# Patient Record
Sex: Female | Born: 1960 | Race: White | Hispanic: No | Marital: Married | Smoking: Former smoker
Health system: Southern US, Community
[De-identification: ages and names within clinical notes are randomized; demographics above are authoritative.]

## PROBLEM LIST (undated history)

## (undated) DIAGNOSIS — I341 Nonrheumatic mitral (valve) prolapse: Secondary | ICD-10-CM

## (undated) DIAGNOSIS — I4891 Unspecified atrial fibrillation: Secondary | ICD-10-CM

## (undated) HISTORY — DX: Nonrheumatic mitral (valve) prolapse: I34.1

## (undated) HISTORY — DX: Unspecified atrial fibrillation: I48.91

## (undated) HISTORY — PX: BREAST LUMPECTOMY: SHX2

---

## 1997-11-30 ENCOUNTER — Encounter: Payer: Self-pay | Admitting: Emergency Medicine

## 1997-11-30 ENCOUNTER — Emergency Department (HOSPITAL_COMMUNITY): Admission: EM | Admit: 1997-11-30 | Discharge: 1997-11-30 | Payer: Self-pay | Admitting: Emergency Medicine

## 1998-02-06 ENCOUNTER — Other Ambulatory Visit: Admission: RE | Admit: 1998-02-06 | Discharge: 1998-02-06 | Payer: Self-pay | Admitting: Obstetrics and Gynecology

## 1998-11-07 ENCOUNTER — Encounter (INDEPENDENT_AMBULATORY_CARE_PROVIDER_SITE_OTHER): Payer: Self-pay | Admitting: Specialist

## 1998-11-07 ENCOUNTER — Ambulatory Visit (HOSPITAL_COMMUNITY): Admission: RE | Admit: 1998-11-07 | Discharge: 1998-11-07 | Payer: Self-pay | Admitting: *Deleted

## 1999-03-10 ENCOUNTER — Other Ambulatory Visit: Admission: RE | Admit: 1999-03-10 | Discharge: 1999-03-10 | Payer: Self-pay | Admitting: Obstetrics and Gynecology

## 2000-03-15 ENCOUNTER — Other Ambulatory Visit: Admission: RE | Admit: 2000-03-15 | Discharge: 2000-03-15 | Payer: Self-pay | Admitting: Obstetrics and Gynecology

## 2001-03-18 ENCOUNTER — Other Ambulatory Visit: Admission: RE | Admit: 2001-03-18 | Discharge: 2001-03-18 | Payer: Self-pay | Admitting: Obstetrics and Gynecology

## 2002-04-14 ENCOUNTER — Other Ambulatory Visit: Admission: RE | Admit: 2002-04-14 | Discharge: 2002-04-14 | Payer: Self-pay | Admitting: Obstetrics and Gynecology

## 2003-11-29 ENCOUNTER — Other Ambulatory Visit: Admission: RE | Admit: 2003-11-29 | Discharge: 2003-11-29 | Payer: Self-pay | Admitting: Obstetrics and Gynecology

## 2004-06-27 ENCOUNTER — Ambulatory Visit: Payer: Self-pay | Admitting: Internal Medicine

## 2004-07-10 ENCOUNTER — Ambulatory Visit: Payer: Self-pay

## 2004-12-29 ENCOUNTER — Other Ambulatory Visit: Admission: RE | Admit: 2004-12-29 | Discharge: 2004-12-29 | Payer: Self-pay | Admitting: Obstetrics and Gynecology

## 2005-10-31 ENCOUNTER — Emergency Department (HOSPITAL_COMMUNITY): Admission: EM | Admit: 2005-10-31 | Discharge: 2005-10-31 | Payer: Self-pay | Admitting: Emergency Medicine

## 2006-01-25 ENCOUNTER — Ambulatory Visit: Payer: Self-pay | Admitting: Psychology

## 2006-01-28 ENCOUNTER — Emergency Department (HOSPITAL_COMMUNITY): Admission: EM | Admit: 2006-01-28 | Discharge: 2006-01-28 | Payer: Self-pay | Admitting: Emergency Medicine

## 2006-02-02 ENCOUNTER — Ambulatory Visit: Payer: Self-pay | Admitting: Psychology

## 2006-02-08 ENCOUNTER — Ambulatory Visit: Payer: Self-pay | Admitting: Psychology

## 2006-02-18 ENCOUNTER — Ambulatory Visit: Payer: Self-pay | Admitting: Psychology

## 2006-04-15 ENCOUNTER — Ambulatory Visit: Payer: Self-pay | Admitting: Psychology

## 2007-02-09 ENCOUNTER — Telehealth: Payer: Self-pay | Admitting: Internal Medicine

## 2007-02-09 ENCOUNTER — Telehealth: Payer: Self-pay | Admitting: *Deleted

## 2007-02-10 ENCOUNTER — Ambulatory Visit: Payer: Self-pay | Admitting: Internal Medicine

## 2007-02-14 LAB — CONVERTED CEMR LAB
Hep B C IgM: NEGATIVE
Hep B Core Total Ab: NEGATIVE
Hep B S AB Quant (Post): 11.5 milliintl units/mL
Mumps IgG: 1.58 — ABNORMAL HIGH
Rubella: 170.2 intl units/mL — ABNORMAL HIGH
Rubeola IgG: 0.78
Varicella IgG: 1.79 — ABNORMAL HIGH

## 2007-07-24 ENCOUNTER — Encounter: Payer: Self-pay | Admitting: Internal Medicine

## 2007-07-25 ENCOUNTER — Telehealth: Payer: Self-pay | Admitting: *Deleted

## 2007-07-26 ENCOUNTER — Ambulatory Visit: Payer: Self-pay | Admitting: Internal Medicine

## 2007-07-27 LAB — CONVERTED CEMR LAB
BUN: 15 mg/dL (ref 6–23)
CO2: 30 meq/L (ref 19–32)
Calcium: 9.9 mg/dL (ref 8.4–10.5)
Chloride: 106 meq/L (ref 96–112)
Creatinine, Ser: 0.8 mg/dL (ref 0.4–1.2)
GFR calc Af Amer: 99 mL/min
GFR calc non Af Amer: 82 mL/min
Glucose, Bld: 119 mg/dL — ABNORMAL HIGH (ref 70–99)
Magnesium: 2 mg/dL (ref 1.5–2.5)
Potassium: 4.3 meq/L (ref 3.5–5.1)
Sodium: 141 meq/L (ref 135–145)
TSH: 0.79 microintl units/mL (ref 0.35–5.50)

## 2007-08-02 ENCOUNTER — Ambulatory Visit: Payer: Self-pay

## 2007-08-02 ENCOUNTER — Encounter: Payer: Self-pay | Admitting: Internal Medicine

## 2007-08-02 ENCOUNTER — Encounter: Payer: Self-pay | Admitting: Cardiology

## 2008-01-13 ENCOUNTER — Telehealth: Payer: Self-pay | Admitting: Internal Medicine
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
## 2008-02-27 ENCOUNTER — Telehealth: Payer: Self-pay | Admitting: Internal Medicine

## 2008-02-29 ENCOUNTER — Ambulatory Visit: Payer: Self-pay | Admitting: Internal Medicine

## 2008-03-06 ENCOUNTER — Ambulatory Visit: Payer: Self-pay | Admitting: Internal Medicine

## 2008-03-06 LAB — CONVERTED CEMR LAB
ALT: 18 units/L (ref 0–35)
AST: 20 units/L (ref 0–37)
Albumin: 3.9 g/dL (ref 3.5–5.2)
Alkaline Phosphatase: 37 units/L — ABNORMAL LOW (ref 39–117)
BUN: 15 mg/dL (ref 6–23)
Basophils Absolute: 0 10*3/uL (ref 0.0–0.1)
Basophils Relative: 0.8 % (ref 0.0–3.0)
Bilirubin, Direct: 0.1 mg/dL (ref 0.0–0.3)
Blood in Urine, dipstick: NEGATIVE
CO2: 27 meq/L (ref 19–32)
Calcium: 9.2 mg/dL (ref 8.4–10.5)
Chloride: 106 meq/L (ref 96–112)
Cholesterol: 197 mg/dL (ref 0–200)
Creatinine, Ser: 0.9 mg/dL (ref 0.4–1.2)
Eosinophils Absolute: 0.1 10*3/uL (ref 0.0–0.7)
Eosinophils Relative: 2.5 % (ref 0.0–5.0)
GFR calc Af Amer: 87 mL/min
GFR calc non Af Amer: 72 mL/min
Glucose, Bld: 77 mg/dL (ref 70–99)
Glucose, Urine, Semiquant: NEGATIVE
HCT: 37.6 % (ref 36.0–46.0)
HDL: 82.6 mg/dL (ref >39.0)
Hemoglobin: 13.1 g/dL (ref 12.0–15.0)
LDL Cholesterol: 103 mg/dL — ABNORMAL HIGH (ref 0–99)
Lymphocytes Relative: 28.1 % (ref 12.0–46.0)
MCHC: 34.9 g/dL (ref 30.0–36.0)
MCV: 90.9 fL (ref 78.0–100.0)
Monocytes Absolute: 0.5 10*3/uL (ref 0.1–1.0)
Monocytes Relative: 9.5 % (ref 3.0–12.0)
Neutro Abs: 2.9 10*3/uL (ref 1.4–7.7)
Neutrophils Relative %: 59.1 % (ref 43.0–77.0)
Nitrite: NEGATIVE
Platelets: 246 10*3/uL (ref 150–400)
Potassium: 4.6 meq/L (ref 3.5–5.1)
RBC: 4.14 M/uL (ref 3.87–5.11)
RDW: 12.1 % (ref 11.5–14.6)
Sodium: 140 meq/L (ref 135–145)
Specific Gravity, Urine: 1.02
TSH: 0.91 microintl units/mL (ref 0.35–5.50)
Total Bilirubin: 0.9 mg/dL (ref 0.3–1.2)
Total CHOL/HDL Ratio: 2.4
Total Protein: 6.6 g/dL (ref 6.0–8.3)
Triglycerides: 56 mg/dL (ref 0–149)
Urobilinogen, UA: 0.2
VLDL: 11 mg/dL (ref 0–40)
WBC Urine, dipstick: NEGATIVE
WBC: 4.8 10*3/uL (ref 4.5–10.5)
pH: 6.5

## 2008-03-12 ENCOUNTER — Ambulatory Visit: Payer: Self-pay | Admitting: Internal Medicine

## 2008-03-12 DIAGNOSIS — R002 Palpitations: Secondary | ICD-10-CM

## 2008-04-26 ENCOUNTER — Telehealth: Payer: Self-pay | Admitting: Internal Medicine

## 2008-04-27 ENCOUNTER — Ambulatory Visit: Payer: Self-pay | Admitting: Internal Medicine

## 2008-04-27 DIAGNOSIS — R1011 Right upper quadrant pain: Secondary | ICD-10-CM | POA: Insufficient documentation

## 2008-04-27 DIAGNOSIS — R1084 Generalized abdominal pain: Secondary | ICD-10-CM

## 2008-04-27 LAB — CONVERTED CEMR LAB
ALT: 13 units/L (ref 0–35)
AST: 18 units/L (ref 0–37)
Albumin: 3.8 g/dL (ref 3.5–5.2)
Alkaline Phosphatase: 54 units/L (ref 39–117)
BUN: 11 mg/dL (ref 6–23)
Basophils Absolute: 0.1 10*3/uL (ref 0.0–0.1)
Basophils Relative: 1.3 % (ref 0.0–3.0)
Bilirubin Urine: NEGATIVE
Bilirubin, Direct: 0.1 mg/dL (ref 0.0–0.3)
CO2: 26 meq/L (ref 19–32)
Calcium: 9.4 mg/dL (ref 8.4–10.5)
Chloride: 105 meq/L (ref 96–112)
Creatinine, Ser: 0.8 mg/dL (ref 0.4–1.2)
Eosinophils Absolute: 0.1 10*3/uL (ref 0.0–0.7)
Eosinophils Relative: 0.7 % (ref 0.0–5.0)
GFR calc Af Amer: 99 mL/min
GFR calc non Af Amer: 82 mL/min
Glucose, Bld: 108 mg/dL — ABNORMAL HIGH (ref 70–99)
Glucose, Urine, Semiquant: NEGATIVE
HCT: 37.7 % (ref 36.0–46.0)
Hemoglobin: 13.1 g/dL (ref 12.0–15.0)
Ketones, urine, test strip: NEGATIVE
Lipase: 25 units/L (ref 11.0–59.0)
Lymphocytes Relative: 11.5 % — ABNORMAL LOW (ref 12.0–46.0)
MCHC: 34.8 g/dL (ref 30.0–36.0)
MCV: 89.3 fL (ref 78.0–100.0)
Monocytes Absolute: 0.7 10*3/uL (ref 0.1–1.0)
Monocytes Relative: 7 % (ref 3.0–12.0)
Neutro Abs: 7.3 10*3/uL (ref 1.4–7.7)
Neutrophils Relative %: 79.5 % — ABNORMAL HIGH (ref 43.0–77.0)
Nitrite: POSITIVE
Platelets: 241 10*3/uL (ref 150–400)
Potassium: 4 meq/L (ref 3.5–5.1)
RBC: 4.23 M/uL (ref 3.87–5.11)
RDW: 11.7 % (ref 11.5–14.6)
Sodium: 139 meq/L (ref 135–145)
Specific Gravity, Urine: 1.02
Total Bilirubin: 0.9 mg/dL (ref 0.3–1.2)
Total Protein: 6.9 g/dL (ref 6.0–8.3)
Urobilinogen, UA: 0.2
WBC: 9.3 10*3/uL (ref 4.5–10.5)
pH: 5.5

## 2008-04-28 ENCOUNTER — Encounter: Payer: Self-pay | Admitting: Internal Medicine

## 2008-05-01 ENCOUNTER — Telehealth: Payer: Self-pay | Admitting: *Deleted

## 2009-03-06 ENCOUNTER — Telehealth (INDEPENDENT_AMBULATORY_CARE_PROVIDER_SITE_OTHER): Payer: Self-pay | Admitting: *Deleted

## 2009-05-15 ENCOUNTER — Telehealth: Payer: Self-pay | Admitting: Internal Medicine

## 2009-05-16 ENCOUNTER — Ambulatory Visit: Payer: Self-pay | Admitting: Internal Medicine

## 2009-05-16 ENCOUNTER — Telehealth (INDEPENDENT_AMBULATORY_CARE_PROVIDER_SITE_OTHER): Payer: Self-pay | Admitting: *Deleted

## 2010-01-16 ENCOUNTER — Ambulatory Visit: Payer: Self-pay | Admitting: Internal Medicine

## 2010-04-01 ENCOUNTER — Telehealth: Payer: Self-pay | Admitting: *Deleted

## 2010-05-06 NOTE — Progress Notes (Signed)
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 Can i sch?  Initial call taken by: Heron Sabins,  May 15, 2009 8:48 AM  Follow-up for Phone Call        ok (if they dont need  chain of custody  test. )  get more specific about what they need  Follow-up by: Madelin Headings MD,  May 15, 2009 1:28 PM  Additional Follow-up for Phone Call Additional follow up Details #1::        need standard drug testing for school admission. 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 Therefore, pt was adv that standard urine drug test would be administered because there are no doucuments / requests to go by stating anything different.... Pt adv that would be fine.  Pt scheduled appt w/ lab for today @ 9:45am for same.  Follow-up by: Debbra Riding,  May 16, 2009 8:58 AM

## 2010-05-06 NOTE — Progress Notes (Signed)
  Left a message for her to return my call about the urine drug screen.

## 2010-05-06 NOTE — Assessment & Plan Note (Signed)
Summary: FLU SHOT//[facility]  Nurse Visit   Allergies: No Known Drug Allergies  Orders Added: 1)  Admin 1st Vaccine [90471] 2)  Flu Vaccine 59yrs + [29562] Flu Vaccine Consent Questions     Do you have a history of severe allergic reactions to this vaccine? no    Any prior history of allergic reactions to egg and/or gelatin? no    Do you have a sensitivity to the preservative Thimersol? no    Do you have a past history of Guillan-Barre Syndrome? no    Do you currently have an acute febrile illness? no    Have you ever had a severe reaction to latex? no    Vaccine information given and explained to patient? yes    Are you currently pregnant? no    Lot Number:[id number]   Exp Date:10/04/2010   Site Given  Left Deltoid IM Romualdo Bolk, CMA Duncan Dull)  January 16, 2010 4:36 PM

## 2010-05-08 NOTE — Progress Notes (Signed)
Summary: Pt needs proof of last flu vaccine, faxed to her work  Phone Note Call from Patient Call back at [employer] [phone]   Caller: Patient Summary of Call: Pt called and needs to have proof of rcvng flu shot this year, sent to her work. Pls fax info to [phone] [facility] ICU attn Antony Odea Initial call taken by: Lucy Antigua,  April 01, 2010 9:57 AM  Follow-up for Phone Call        results faxed to [facility] ICU Follow-up by: Romualdo Bolk, CMA Duncan Dull),  April 01, 2010 12:04 PM

## 2011-01-30 ENCOUNTER — Ambulatory Visit (INDEPENDENT_AMBULATORY_CARE_PROVIDER_SITE_OTHER): Payer: PRIVATE HEALTH INSURANCE | Admitting: Gynecology

## 2011-01-30 ENCOUNTER — Encounter: Payer: Self-pay | Admitting: Gynecology

## 2011-01-30 VITALS — BP 130/80 | Ht 66.5 in | Wt 149.0 lb

## 2011-01-30 DIAGNOSIS — N926 Irregular menstruation, unspecified: Secondary | ICD-10-CM

## 2011-01-30 DIAGNOSIS — I4891 Unspecified atrial fibrillation: Secondary | ICD-10-CM | POA: Insufficient documentation

## 2011-01-30 DIAGNOSIS — I341 Nonrheumatic mitral (valve) prolapse: Secondary | ICD-10-CM | POA: Insufficient documentation

## 2011-01-30 DIAGNOSIS — D259 Leiomyoma of uterus, unspecified: Secondary | ICD-10-CM

## 2011-01-30 NOTE — Progress Notes (Signed)
50 year old G2 P2 vasectomy birth control presents complaining of worsening menorrhagia irregular menses with bleeding on and off throughout the month pelvic pressure dyspareunia. History of leiomyoma which appear to be getting larger over serial exams by other physicians. She had a sonohysterogram year ago showed a normal cavity with multiple myomas the largest measuring 5 cm per office visit note through Dr. Octavio Graves office. Follow up office note May 2012 showed a 12 week size uterus and repeat ultrasound at that time again confirmed multiple myomas right ovary was normal left ovary was not visualized clearly. Patient had been given a trial of oral contraceptives but had bleeding through this and wants to discuss alternatives. She is up to date as far as regular exams the last of which was in [place] in May. She has no history of significantly abnormal Pap smears.  Exam Abdomen soft nontender without masses guarding rebound organomegaly Pelvic external BUS vagina normal cervix within 2 fingerbreadths of the introitus, uterus retroverted bulky 10-12 week size adnexa without clear masses rectovaginal exam confirms  Assessment and plan: Worsening menorrhagia irregular menses pelvic pressure dyspareunia leiomyoma. Options were reviewed to include observation continued attempts at hormonal manipulation Mirena IUD endometrial ablation uterine artery embolization multiple myomectomy and hysterectomy. I reviewed with her average age of menopause is 20 at age 66 the question is whether she could be temporized through menopause or not recognizing that she could still have years of symptoms in the discomfort. I think given the symptoms of pelvic pressure and dyspareunia described as deep dyspareunia something being hit with her cervix 2 fingerbreadths within the introitus I think hysterectomy is probably the most appropriate choice to take care of all of her issues. She has had 2 vaginal births think that we could  approach this either as a LAVH or TVH and the recommendation would be to preoperatively treat her with a 2 - 3 month course of depo Lupron 2 stop her irregular bleeding maximize her hemoglobin and hopefully shrink her uterus to increase the likelihood of a successful vaginal hysterectomy. The differential suggest that we check an FSH to see where we stand as well as a TSH as baseline CBC. You should endometrial biopsy was discussed she had a negative sonohysterogram a year ago I think that if we're going to move toward surgery that is not essential given the obvious etiology and her younger age. I also do not think this necessarily repeat the ultrasound.  The patient will follow up for lab work and then we'll move towards scheduling her surgery and Depo-Lupron preoperatively. She will represent for a full preoperative consult before hand.

## 2011-02-09 ENCOUNTER — Telehealth: Payer: Self-pay | Admitting: *Deleted

## 2011-02-09 NOTE — Telephone Encounter (Signed)
Pt called wanting lab result from recent office visit, results given to pt. Pt also wanted to know if insurance was going to help pay for her depo lupron, per amy insurance company has not finished processing pt benefits. Pt informed of this as well.

## 2011-02-10 ENCOUNTER — Telehealth: Payer: Self-pay | Admitting: *Deleted

## 2011-02-10 NOTE — Telephone Encounter (Signed)
Pt called complaining of lower pelvic vibration sensation. She states it vibrates about 12 times per min. I told pt I have no clue what this could be and she should make office visit to see doctor. Pt says she will watch for now and call back if this should continue.

## 2011-02-18 ENCOUNTER — Telehealth: Payer: Self-pay | Admitting: *Deleted

## 2011-02-18 NOTE — Telephone Encounter (Signed)
Message copied by Libby Maw on Wed Feb 18, 2011 11:12 AM ------      Message from: Richardson Chiquito      Created: Mon Feb 02, 2011  9:11 AM                   ----- Message -----         From: Dara Lords, MD         Sent: 01/30/2011   5:13 PM           To: Janus Molder, CMA            Arrange for Depo-Lupron 11.25 mg as a preoperative treatment for planned hysterectomy diagnosis leiomyoma menorrhagia patient will have hysterectomy in the 2-3 month window after the shot. I sent a request through to schedule the surgery with Olegario Messier she can coordinate this with you

## 2011-02-18 NOTE — Telephone Encounter (Signed)
Patient called back about benefits for Lupron.  Her cost would be over 2000 dollars.  Wants to know if shot necessary for surgery or is there another option?

## 2011-02-18 NOTE — Telephone Encounter (Signed)
One of the purposes for Lupron would be to shrink the uterus to improve the chances that we would be successful vaginally. Having said that we could try suppressing her periods with Megace 20 mg daily through surgery and then we could then schedule her now for the surgery instead of waiting the 2-3 months. If she wants to do that let me know and I will prescribe the Megace and get her surgery scheduled.  Other option would be to try to see if the Depo-Lupron company would discount the shot.

## 2011-02-19 MED ORDER — MEGESTROL ACETATE 20 MG PO TABS: 20.0000 mg | ORAL_TABLET | Freq: Every day | ORAL | Status: AC

## 2011-02-19 NOTE — Telephone Encounter (Signed)
Plan Megace 20 mg daily and I will have Carrie Velasquez contact her to arrange surgery. Order was placed for the Megace

## 2011-02-19 NOTE — Telephone Encounter (Signed)
Patient said she would like to proceed with Megace.

## 2011-02-20 ENCOUNTER — Telehealth: Payer: Self-pay

## 2011-02-20 NOTE — Telephone Encounter (Signed)
Left message for patient to call me regarding scheduling surgery.

## 2011-02-20 NOTE — Telephone Encounter (Signed)
Patient called regarding surgery.  She does not think now is a good time.  She is new at her job and cannot afford to be out.  She asked how long she would be out if Dr. Velvet Bathe had to do abdominal hyst. I told her 4-6 weeks. She said no way possible at all that she could do that. She asked about how long out of work with LAVH. 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  She asked about her insurance/ financial arrangements and we discussed that.  She has a ski trip planned for [facility] at [place] and asked if she would be able to do that if she tried to fit it in in December.  I told her that did not sound like a wise thing.  I would not have anything before Dec 11th and no time for recovery. She said she wants to wait until Spring 2013. She will have been with her job longer by then and she has another ski trip scheduled 2013 and will need to work around that.  She said she will call me when she can work it out but she just does not feel like the time is now.  She said, also, she may be able to do the Lupron first in the Spring.

## 2011-05-28 ENCOUNTER — Telehealth: Payer: Self-pay

## 2011-05-28 NOTE — Telephone Encounter (Signed)
Yes I want her to start Megace 20 mg daily through surgery to see if we can suppress her menses. If she needs more than we can call that in for her.

## 2011-05-28 NOTE — Telephone Encounter (Signed)
We can plan for an LAVH.

## 2011-05-28 NOTE — Telephone Encounter (Signed)
Patient called to schedule LAVH hopefully for April. Her insurance is changing in May and she wants to get it in before then.  She said you had planned LAVH.  She wanted you to know that the Lupron was just too expensive and she could not do that.  She has the RX for Megace that she filled back in November but has not taken it and wondered if you want her to take that.    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 She asked about time out of work she said she heard 2-4 weeks.  I told her that was correct but that two weeks was probably for more sedentary positions and she may find that she needs the 3-4 weeks.   Please advise what she needs to be doing/taking meantime to prepare for surgery.  I did tell her that at last visit you recommended iron tab qd until surgery and she will start on that.

## 2011-05-28 NOTE — Telephone Encounter (Signed)
Just wanted to be sure you saw my note below. Did you want her to take the Megace before surgery?

## 2011-05-29 ENCOUNTER — Telehealth: Payer: Self-pay | Admitting: Internal Medicine

## 2011-05-29 NOTE — Telephone Encounter (Signed)
Pt would like shannon to return her call personally

## 2011-05-29 NOTE — Telephone Encounter (Signed)
Spoke to pt and answered all questions

## 2011-05-29 NOTE — Telephone Encounter (Signed)
Patient advised.

## 2011-06-01 ENCOUNTER — Telehealth: Payer: Self-pay

## 2011-06-01 NOTE — Telephone Encounter (Signed)
Patient is considering scheduling surgery mid-April.  She asked if it would be okay for her to fly to [place] the first week of May for [employer] ("I would be in my 3rd week of recovery".).   Also, patient said the highest OTC iron tablet she could find was 27mg  and she wondered if you needed to call her in a prescription for iron?

## 2011-06-02 NOTE — Telephone Encounter (Signed)
Patient called because she has not heard back from me.  She is ready to schedule but needed to know about air travel per note below.  Also, wanted to add that she needs Megace refills.   Dr. Tilford Pillar see note below and advise. Thanks

## 2011-06-02 NOTE — Telephone Encounter (Signed)
If the patient has a successful laparoscopic hysterectomy then she probably could take a trip in 3 weeks with no heavy lifting. If she has any postoperative complications or if we have to do an abdominal approach, which in her case given the larger nature of her uterus is a real possibility, that would be unlikely at 2-3 weeks after abdominal hysterectomy she would be able to travel from a discomfort standpoint although she was feeling well she could consider it.

## 2011-06-03 ENCOUNTER — Telehealth: Payer: Self-pay

## 2011-06-03 MED ORDER — MEGESTROL ACETATE 20 MG PO TABS: 20.0000 mg | ORAL_TABLET | Freq: Every day | ORAL | Status: AC

## 2011-06-03 NOTE — Telephone Encounter (Signed)
Also, patient said the highest OTC iron tablet she could find was 27mg  and she wondered if you needed to call her in a prescription for iron?

## 2011-06-03 NOTE — Telephone Encounter (Signed)
I closed last phone call encounter unintentionally before I was finished.  I advised patient what Dr. Velvet Bathe said regarding travel several weeks after surgery and also that she should be able to find OTC iron supplement and to just ask pharmacist for assistance.  I spoke with her regarding surgery dates and she wanted April 9 and we scheduled her that day at 7:30am.  She will call me back to schedule her preop consult.  She needs additional megace called in and I will do that for her per Dr. Velvet Bathe.

## 2011-06-03 NOTE — Telephone Encounter (Signed)
There plenty of iron supplements OTC. She's looking for a 325 mg equivalent. Just asked the pharmacist and he/she should be able to help. I know [facility] has plenty of iron supplements available.

## 2011-06-25 ENCOUNTER — Encounter (HOSPITAL_COMMUNITY): Payer: Self-pay | Admitting: Pharmacist

## 2011-07-06 ENCOUNTER — Ambulatory Visit (INDEPENDENT_AMBULATORY_CARE_PROVIDER_SITE_OTHER): Payer: PRIVATE HEALTH INSURANCE | Admitting: Gynecology

## 2011-07-06 ENCOUNTER — Encounter: Payer: Self-pay | Admitting: Gynecology

## 2011-07-06 VITALS — BP 120/76

## 2011-07-06 DIAGNOSIS — N920 Excessive and frequent menstruation with regular cycle: Secondary | ICD-10-CM

## 2011-07-06 DIAGNOSIS — D259 Leiomyoma of uterus, unspecified: Secondary | ICD-10-CM

## 2011-07-06 DIAGNOSIS — N926 Irregular menstruation, unspecified: Secondary | ICD-10-CM

## 2011-07-06 DIAGNOSIS — IMO0002 Reserved for concepts with insufficient information to code with codable children: Secondary | ICD-10-CM

## 2011-07-06 NOTE — H&P (Signed)
Carrie Velasquez 1960/08/21 [id number]   History and Physical    Chief complaint:  worsening menorrhagia, irregular menses with bleeding on and off throughout the month, pelvic pressure, dyspareunia.   History of present illness: 51 y.o.  G2 P2 vasectomy birth control presents complaining of worsening menorrhagia, irregular menses with bleeding on and off throughout the month, pelvic pressure, dyspareunia.  History of leiomyoma with sonohysterogram approximately a year ago showed a normal cavity with multiple myomas. Options were reviewed to include observation, continued attempts at hormonal manipulation, Mirena IUD, endometrial ablation, uterine artery embolization, multiple myomectomy and hysterectomy. Patient was evaluated in the fall initially and plan was to begin Depo-Lupron suppression with follow up hysterectomy but do to cost of the medication she did not start this and currently has been suppressing menses with Megace and supplementing her iron orally. She is admitted at this time for LAVH.    Past medical history,surgical history, medications, allergies, family history and social history were all reviewed and documented in the EPIC chart. ROS:  Was performed and pertinent positives and negatives are included in the history of present illness.  Exam: General: well developed, well nourished female, no acute distress HEENT: normal  Lungs: clear to auscultation without wheezing, rales or rhonchi  Cardiac: regular rate without rubs, murmurs or gallops  Abdomen: soft, nontender without masses, guarding, rebound, organomegaly  Pelvic: external bus vagina: normal   Cervix: grossly normal within 2 fingerbreadths of the introitus Uterus: 12 weeks size, retroverted, midline and mobile, nontender.  Adnexa: without masses or tenderness      Assessment/Plan:  51 year old G2 P2 female with multiple myomas menorrhagia, irregular menses, iron deficiency anemia, pelvic pressure and  dyspareunia. Options for management were outlined as per above. We initially had planned for Depo-Lupron suppression both for hemoglobin recovery and strength of the uterus the 2 tubes expensive medication she never started this. We have attempted to suppress her menses with Megace and she has been on iron supplementation. Check of her FSH and the fall was normal at 7. She is admitted at this time for LAVH. I reviewed is involved with hysterectomy with her and her husband to include absolute and irreversible sterility. Sexuality following hysterectomy was also reviewed with the potential for persistent orgasmic dysfunction and persistent dyspareunia understood and accepted.  The ovarian conservation issue was reviewed with her particularly at age 51 and the options of keeping both ovaries or removing both ovaries reviewed. The patient understands by removing her ovaries she would be at risk for symptomatic hypoestrogenism as well as the potential for accelerated cardiovascular risk and osteoporosis. She would have risk reductive surgery as far as ovarian cancer and breast cancer. By keeping her ovaries she will keep hormonal function to also will have the risk of ovarian disease in the future to include benign disease requiring reoperation as well as the risk of ovarian cancer. She has no strong history of ovarian cancer and breast cancer in the family and she prefers to keep both ovaries accepting the risks of ovarian disease in the future, but she does give me the permission to remove one or both ovaries if at the time of surgery significant disease is encountered and is my best intraoperative decision to do so.The expected intraoperative and postoperative courses were reviewed with them.  We will attempt a laparoscopic approach and I reviewed what is involved with this to include multiple port sites, insufflation, trocar placement. She understands given the bulk of her uterus or if complications  arise, that we  may need to abandon this approach and proceed with a TAH which would mean a larger incision and a longer recovery and she understands and accepts this. The risk of infection requiring prolonged antibiotics, opening and draining of abscess/hematoma formation. Incisional complications requiring opening and draining of incisions, closure by secondary intention and long-term issues of cosmetics/keloids and hernia formation reviewed. The realistic risk of transfusion given her lower hemoglobin preoperatively and the risks of transfusion reaction, hepatitis, HIV, mad cow disease and other unknown entities was all discussed understood and accepted. The risk of inadvertent injury to internal organs including bowel, bladder, ureters, vessels and nerves necessitating major exploratory reparative surgeries and future reparative surgeries, either immediately recognized or delay recognized, including bowel resection, bladder repair, ureteral damage repair, ostomy formation was all discussed understood and accepted. The patient's questions were answered to her satisfaction she is ready to proceed with surgery.    Dara Lords MD, 5:23 PM 07/06/2011

## 2011-07-06 NOTE — Patient Instructions (Signed)
Followup for surgery as scheduled. 

## 2011-07-06 NOTE — Progress Notes (Signed)
ENRIQUE WEISS Apr 10, 1960 [id number]   Preoperative consult    Chief complaint:  worsening menorrhagia, irregular menses with bleeding on and off throughout the month, pelvic pressure, dyspareunia.   History of present illness: 51 y.o.  G2 P2 vasectomy birth control presents complaining of worsening menorrhagia, irregular menses with bleeding on and off throughout the month, pelvic pressure, dyspareunia.  History of leiomyoma with sonohysterogram approximately a year ago showed a normal cavity with multiple myomas. Options were reviewed to include observation, continued attempts at hormonal manipulation, Mirena IUD, endometrial ablation, uterine artery embolization, multiple myomectomy and hysterectomy. Patient was evaluated in the fall initially and plan was to begin Depo-Lupron suppression with follow up hysterectomy but do to cost of the medication she did not start this and currently has been suppressing menses with Megace and supplementing her iron orally. She is admitted at this time for LAVH.    Past medical history,surgical history, medications, allergies, family history and social history were all reviewed and documented in the EPIC chart. ROS:  Was performed and pertinent positives and negatives are included in the history of present illness.  Exam: General: well developed, well nourished female, no acute distress HEENT: normal  Lungs: clear to auscultation without wheezing, rales or rhonchi  Cardiac: regular rate without rubs, murmurs or gallops  Abdomen: soft, nontender without masses, guarding, rebound, organomegaly  Pelvic: external bus vagina: normal   Cervix: grossly normal within 2 fingerbreadths of the introitus Uterus: 12 weeks size, retroverted, midline and mobile, nontender.  Adnexa: without masses or tenderness      Assessment/Plan:  51 year old G2 P2 female with multiple myomas menorrhagia, irregular menses, iron deficiency anemia, pelvic pressure and  dyspareunia. Options for management were outlined as per above. We initially had planned for Depo-Lupron suppression both for hemoglobin recovery and strength of the uterus the 2 tubes expensive medication she never started this. We have attempted to suppress her menses with Megace and she has been on iron supplementation. Check of her FSH and the fall was normal at 7. She is admitted at this time for LAVH. I reviewed is involved with hysterectomy with her and her husband to include absolute and irreversible sterility. Sexuality following hysterectomy was also reviewed with the potential for persistent orgasmic dysfunction and persistent dyspareunia understood and accepted.  The ovarian conservation issue was reviewed with her particularly at age 76 and the options of keeping both ovaries or removing both ovaries reviewed. The patient understands by removing her ovaries she would be at risk for symptomatic hypoestrogenism as well as the potential for accelerated cardiovascular risk and osteoporosis. She would have risk reductive surgery as far as ovarian cancer and breast cancer. By keeping her ovaries she will keep hormonal function to also will have the risk of ovarian disease in the future to include benign disease requiring reoperation as well as the risk of ovarian cancer. She has no strong history of ovarian cancer and breast cancer in the family and she prefers to keep both ovaries accepting the risks of ovarian disease in the future, but she does give me the permission to remove one or both ovaries if at the time of surgery significant disease is encountered and is my best intraoperative decision to do so.The expected intraoperative and postoperative courses were reviewed with them.  We will attempt a laparoscopic approach and I reviewed what is involved with this to include multiple port sites, insufflation, trocar placement. She understands given the bulk of her uterus or if complications arise,  that we  may need to abandon this approach and proceed with a TAH which would mean a larger incision and a longer recovery and she understands and accepts this. The risk of infection requiring prolonged antibiotics, opening and draining of abscess/hematoma formation. Incisional complications requiring opening and draining of incisions, closure by secondary intention and long-term issues of cosmetics/keloids and hernia formation reviewed. The realistic risk of transfusion given her lower hemoglobin preoperatively and the risks of transfusion reaction, hepatitis, HIV, mad cow disease and other unknown entities was all discussed understood and accepted. The risk of inadvertent injury to internal organs including bowel, bladder, ureters, vessels and nerves necessitating major exploratory reparative surgeries and future reparative surgeries, either immediately recognized or delay recognized, including bowel resection, bladder repair, ureteral damage repair, ostomy formation was all discussed understood and accepted. The patient's questions were answered to her satisfaction she is ready to proceed with surgery.    Dara Lords MD, 5:09 PM 07/06/2011

## 2011-07-08 ENCOUNTER — Encounter (HOSPITAL_COMMUNITY): Payer: Self-pay

## 2011-07-08 ENCOUNTER — Encounter (HOSPITAL_COMMUNITY)
Admission: RE | Admit: 2011-07-08 | Discharge: 2011-07-08 | Disposition: A | Discharge status: Home / Self Care | Payer: Medicaid - Out of State | Source: Ambulatory Visit | Attending: Gynecology | Admitting: Gynecology

## 2011-07-08 LAB — COMPREHENSIVE METABOLIC PANEL
ALT: 21 U/L (ref 0–35)
AST: 22 U/L (ref 0–37)
Albumin: 4.2 g/dL (ref 3.5–5.2)
Alkaline Phosphatase: 46 U/L (ref 39–117)
Chloride: 104 mEq/L (ref 96–112)
Potassium: 4.6 mEq/L (ref 3.5–5.1)
Sodium: 136 mEq/L (ref 135–145)
Total Bilirubin: 0.6 mg/dL (ref 0.3–1.2)

## 2011-07-08 LAB — CBC
Platelets: 320 10*3/uL (ref 150–400)
RDW: 18.9 % — ABNORMAL HIGH (ref 11.5–15.5)
WBC: 6.5 10*3/uL (ref 4.0–10.5)

## 2011-07-08 LAB — SURGICAL PCR SCREEN: Staphylococcus aureus: POSITIVE — AB

## 2011-07-08 NOTE — Patient Instructions (Signed)
YOUR PROCEDURE IS SCHEDULED ON:07/14/11  ENTER THROUGH THE MAIN ENTRANCE OF [facility] AT:0600 am  USE DESK PHONE AND DIAL [phone] TO INFORM us OF YOUR ARRIVAL  CALL [phone] IF YOU HAVE ANY QUESTIONS OR PROBLEMS PRIOR TO YOUR ARRIVAL.  REMEMBER: DO NOT EAT OR DRINK AFTER MIDNIGHT :Monday  SPECIAL INSTRUCTIONS:   YOU MAY BRUSH YOUR TEETH THE MORNING OF SURGERY   TAKE THESE MEDICINES THE DAY OF SURGERY WITH SIP OF WATER:none   DO NOT WEAR JEWELRY, EYE MAKEUP, LIPSTICK OR DARK FINGERNAIL POLISH DO NOT WEAR LOTIONS  DO NOT SHAVE FOR 48 HOURS PRIOR TO SURGERY  YOU WILL NOT BE ALLOWED TO DRIVE YOURSELF HOME.  NAME OF DRIVER:Dan Su Hilt

## 2011-07-13 MED ORDER — DEXTROSE 5 % IV SOLN
1.0000 g | INTRAVENOUS | Status: AC
  Administered 2011-07-14: 1 g via INTRAVENOUS
  Filled 2011-07-13: qty 1

## 2011-07-14 ENCOUNTER — Encounter (HOSPITAL_COMMUNITY): Payer: Self-pay | Admitting: Anesthesiology

## 2011-07-14 ENCOUNTER — Ambulatory Visit (HOSPITAL_COMMUNITY): Payer: Medicaid - Out of State | Admitting: Anesthesiology

## 2011-07-14 ENCOUNTER — Ambulatory Visit (HOSPITAL_COMMUNITY)
Admission: RE | Admit: 2011-07-14 | Discharge: 2011-07-14 | Disposition: A | Discharge status: Home / Self Care | Payer: Medicaid - Out of State | Source: Ambulatory Visit | Attending: Gynecology | Admitting: Gynecology

## 2011-07-14 ENCOUNTER — Encounter (HOSPITAL_COMMUNITY): Payer: Self-pay | Admitting: *Deleted

## 2011-07-14 ENCOUNTER — Other Ambulatory Visit: Payer: Self-pay | Admitting: Gynecology

## 2011-07-14 ENCOUNTER — Telehealth: Payer: Self-pay | Admitting: *Deleted

## 2011-07-14 ENCOUNTER — Encounter (HOSPITAL_COMMUNITY)
Admission: RE | Disposition: A | Discharge status: Home / Self Care | Payer: Self-pay | Source: Ambulatory Visit | Attending: Gynecology

## 2011-07-14 DIAGNOSIS — N920 Excessive and frequent menstruation with regular cycle: Secondary | ICD-10-CM | POA: Insufficient documentation

## 2011-07-14 DIAGNOSIS — IMO0002 Reserved for concepts with insufficient information to code with codable children: Secondary | ICD-10-CM | POA: Insufficient documentation

## 2011-07-14 DIAGNOSIS — Z9889 Other specified postprocedural states: Secondary | ICD-10-CM

## 2011-07-14 DIAGNOSIS — Z01812 Encounter for preprocedural laboratory examination: Secondary | ICD-10-CM | POA: Insufficient documentation

## 2011-07-14 DIAGNOSIS — D509 Iron deficiency anemia, unspecified: Secondary | ICD-10-CM | POA: Insufficient documentation

## 2011-07-14 DIAGNOSIS — D259 Leiomyoma of uterus, unspecified: Secondary | ICD-10-CM

## 2011-07-14 DIAGNOSIS — N926 Irregular menstruation, unspecified: Secondary | ICD-10-CM | POA: Insufficient documentation

## 2011-07-14 DIAGNOSIS — Z01818 Encounter for other preprocedural examination: Secondary | ICD-10-CM | POA: Insufficient documentation

## 2011-07-14 DIAGNOSIS — N949 Unspecified condition associated with female genital organs and menstrual cycle: Secondary | ICD-10-CM | POA: Insufficient documentation

## 2011-07-14 HISTORY — PX: LAPAROSCOPIC ASSISTED VAGINAL HYSTERECTOMY: SHX5398

## 2011-07-14 SURGERY — HYSTERECTOMY, VAGINAL, LAPAROSCOPY-ASSISTED
Anesthesia: General | Wound class: Clean Contaminated

## 2011-07-14 MED ORDER — KETOROLAC TROMETHAMINE 30 MG/ML IJ SOLN: 30.0000 mg | Freq: Four times a day (QID) | INTRAMUSCULAR | Status: DC

## 2011-07-14 MED ORDER — HYDROMORPHONE HCL PF 1 MG/ML IJ SOLN
INTRAMUSCULAR | Status: AC
  Filled 2011-07-14: qty 1

## 2011-07-14 MED ORDER — OXYCODONE-ACETAMINOPHEN 5-325 MG PO TABS
1.0000 | ORAL_TABLET | ORAL | Status: DC | PRN
  Administered 2011-07-14: 2 via ORAL
  Filled 2011-07-14: qty 2

## 2011-07-14 MED ORDER — MIDAZOLAM HCL 2 MG/2ML IJ SOLN
INTRAMUSCULAR | Status: AC
  Filled 2011-07-14: qty 2

## 2011-07-14 MED ORDER — ONDANSETRON HCL 4 MG/2ML IJ SOLN
INTRAMUSCULAR | Status: AC
  Filled 2011-07-14: qty 2

## 2011-07-14 MED ORDER — DEXTROSE-NACL 5-0.9 % IV SOLN
INTRAVENOUS | Status: DC
  Administered 2011-07-14: 15:00:00 via INTRAVENOUS

## 2011-07-14 MED ORDER — HYDROCODONE-ACETAMINOPHEN 5-325 MG PO TABS: 2.0000 | ORAL_TABLET | Freq: Four times a day (QID) | ORAL | Status: AC | PRN

## 2011-07-14 MED ORDER — KETOROLAC TROMETHAMINE 30 MG/ML IJ SOLN
INTRAMUSCULAR | Status: AC
  Filled 2011-07-14: qty 1

## 2011-07-14 MED ORDER — GLYCOPYRROLATE 0.2 MG/ML IJ SOLN
INTRAMUSCULAR | Status: DC | PRN
  Administered 2011-07-14: 0.4 mg via INTRAVENOUS

## 2011-07-14 MED ORDER — DEXAMETHASONE SODIUM PHOSPHATE 10 MG/ML IJ SOLN
INTRAMUSCULAR | Status: AC
  Filled 2011-07-14: qty 1

## 2011-07-14 MED ORDER — ROCURONIUM BROMIDE 100 MG/10ML IV SOLN
INTRAVENOUS | Status: DC | PRN
  Administered 2011-07-14: 10 mg via INTRAVENOUS
  Administered 2011-07-14: 40 mg via INTRAVENOUS

## 2011-07-14 MED ORDER — ONDANSETRON HCL 4 MG PO TABS: 4.0000 mg | ORAL_TABLET | Freq: Four times a day (QID) | ORAL | Status: DC | PRN

## 2011-07-14 MED ORDER — PROPOFOL 10 MG/ML IV EMUL
INTRAVENOUS | Status: AC
  Filled 2011-07-14: qty 20

## 2011-07-14 MED ORDER — DEXAMETHASONE SODIUM PHOSPHATE 4 MG/ML IJ SOLN
INTRAMUSCULAR | Status: DC | PRN
  Administered 2011-07-14: 10 mg via INTRAVENOUS

## 2011-07-14 MED ORDER — PROPOFOL 10 MG/ML IV EMUL
INTRAVENOUS | Status: DC | PRN
  Administered 2011-07-14: 150 mg via INTRAVENOUS

## 2011-07-14 MED ORDER — FENTANYL CITRATE 0.05 MG/ML IJ SOLN
INTRAMUSCULAR | Status: AC
  Filled 2011-07-14: qty 5

## 2011-07-14 MED ORDER — NEOSTIGMINE METHYLSULFATE 1 MG/ML IJ SOLN
INTRAMUSCULAR | Status: DC | PRN
  Administered 2011-07-14: 3 mg via INTRAVENOUS

## 2011-07-14 MED ORDER — METOCLOPRAMIDE HCL 5 MG/ML IJ SOLN
INTRAMUSCULAR | Status: DC | PRN
  Administered 2011-07-14: 10 mg via INTRAVENOUS

## 2011-07-14 MED ORDER — LIDOCAINE HCL (CARDIAC) 20 MG/ML IV SOLN
INTRAVENOUS | Status: AC
  Filled 2011-07-14: qty 5

## 2011-07-14 MED ORDER — LIDOCAINE HCL (CARDIAC) 20 MG/ML IV SOLN
INTRAVENOUS | Status: DC | PRN
  Administered 2011-07-14: 40 mg via INTRAVENOUS

## 2011-07-14 MED ORDER — DIPHENHYDRAMINE HCL 25 MG PO CAPS
50.0000 mg | ORAL_CAPSULE | Freq: Four times a day (QID) | ORAL | Status: DC | PRN
  Filled 2011-07-14: qty 1

## 2011-07-14 MED ORDER — BUPIVACAINE HCL (PF) 0.25 % IJ SOLN
INTRAMUSCULAR | Status: DC | PRN
  Administered 2011-07-14: 30 mL

## 2011-07-14 MED ORDER — MORPHINE SULFATE 4 MG/ML IJ SOLN
2.0000 mg | INTRAMUSCULAR | Status: DC | PRN
  Administered 2011-07-14: 2 mg via INTRAVENOUS
  Filled 2011-07-14: qty 1

## 2011-07-14 MED ORDER — LIDOCAINE-EPINEPHRINE 1 %-1:100000 IJ SOLN
INTRAMUSCULAR | Status: DC | PRN
  Administered 2011-07-14: 30 mL

## 2011-07-14 MED ORDER — LACTATED RINGERS IV SOLN
INTRAVENOUS | Status: DC
  Administered 2011-07-14: 07:00:00 via INTRAVENOUS

## 2011-07-14 MED ORDER — KETOROLAC TROMETHAMINE 30 MG/ML IJ SOLN
INTRAMUSCULAR | Status: DC | PRN
  Administered 2011-07-14: 30 mg via INTRAVENOUS

## 2011-07-14 MED ORDER — NEOSTIGMINE METHYLSULFATE 1 MG/ML IJ SOLN
INTRAMUSCULAR | Status: AC
  Filled 2011-07-14: qty 10

## 2011-07-14 MED ORDER — METOCLOPRAMIDE HCL 5 MG/ML IJ SOLN: 10.0000 mg | Freq: Once | INTRAMUSCULAR | Status: DC | PRN

## 2011-07-14 MED ORDER — HYDROMORPHONE HCL PF 1 MG/ML IJ SOLN: 0.2500 mg | INTRAMUSCULAR | Status: DC | PRN

## 2011-07-14 MED ORDER — BUPIVACAINE HCL (PF) 0.25 % IJ SOLN
INTRAMUSCULAR | Status: AC
  Filled 2011-07-14: qty 30

## 2011-07-14 MED ORDER — GLYCOPYRROLATE 0.2 MG/ML IJ SOLN
INTRAMUSCULAR | Status: AC
  Filled 2011-07-14: qty 1

## 2011-07-14 MED ORDER — KETOROLAC TROMETHAMINE 30 MG/ML IJ SOLN
30.0000 mg | Freq: Four times a day (QID) | INTRAMUSCULAR | Status: DC
  Administered 2011-07-14: 30 mg via INTRAVENOUS
  Filled 2011-07-14: qty 1

## 2011-07-14 MED ORDER — HYDROMORPHONE HCL PF 1 MG/ML IJ SOLN
INTRAMUSCULAR | Status: DC | PRN
  Administered 2011-07-14: 1 mg via INTRAVENOUS

## 2011-07-14 MED ORDER — ROCURONIUM BROMIDE 50 MG/5ML IV SOLN
INTRAVENOUS | Status: AC
  Filled 2011-07-14: qty 1

## 2011-07-14 MED ORDER — DEXTROSE-NACL 5-0.9 % IV SOLN: INTRAVENOUS | Status: DC

## 2011-07-14 MED ORDER — MEPERIDINE HCL 25 MG/ML IJ SOLN: 6.2500 mg | INTRAMUSCULAR | Status: DC | PRN

## 2011-07-14 MED ORDER — ONDANSETRON HCL 4 MG/2ML IJ SOLN: 4.0000 mg | Freq: Four times a day (QID) | INTRAMUSCULAR | Status: DC | PRN

## 2011-07-14 MED ORDER — MIDAZOLAM HCL 5 MG/5ML IJ SOLN
INTRAMUSCULAR | Status: DC | PRN
  Administered 2011-07-14: 2 mg via INTRAVENOUS

## 2011-07-14 MED ORDER — FENTANYL CITRATE 0.05 MG/ML IJ SOLN
INTRAMUSCULAR | Status: AC
  Filled 2011-07-14: qty 2

## 2011-07-14 MED ORDER — FENTANYL CITRATE 0.05 MG/ML IJ SOLN
INTRAMUSCULAR | Status: DC | PRN
  Administered 2011-07-14: 100 ug via INTRAVENOUS
  Administered 2011-07-14: 50 ug via INTRAVENOUS
  Administered 2011-07-14 (×3): 100 ug via INTRAVENOUS
  Administered 2011-07-14: 50 ug via INTRAVENOUS
  Administered 2011-07-14: 100 ug via INTRAVENOUS

## 2011-07-14 MED ORDER — ONDANSETRON HCL 4 MG/2ML IJ SOLN
INTRAMUSCULAR | Status: DC | PRN
  Administered 2011-07-14: 4 mg via INTRAVENOUS

## 2011-07-14 SURGICAL SUPPLY — 43 items
ADH SKN CLS APL DERMABOND .7 (GAUZE/BANDAGES/DRESSINGS) ×1
CABLE HIGH FREQUENCY MONO STRZ (ELECTRODE) IMPLANT
CLOTH BEACON ORANGE TIMEOUT ST (SAFETY) ×2 IMPLANT
CONT PATH 16OZ SNAP LID 3702 (MISCELLANEOUS) ×2 IMPLANT
COVER TABLE BACK 60X90 (DRAPES) ×2 IMPLANT
DECANTER SPIKE VIAL GLASS SM (MISCELLANEOUS) IMPLANT
DERMABOND ADVANCED (GAUZE/BANDAGES/DRESSINGS) ×1
DERMABOND ADVANCED .7 DNX12 (GAUZE/BANDAGES/DRESSINGS) ×1 IMPLANT
DRAPE UTILITY XL STRL (DRAPES) ×2 IMPLANT
ELECT LIGASURE LONG (ELECTRODE) ×1 IMPLANT
ELECT REM PT RETURN 9FT ADLT (ELECTROSURGICAL)
ELECTRODE REM PT RTRN 9FT ADLT (ELECTROSURGICAL) IMPLANT
GLOVE BIO SURGEON STRL SZ7.5 (GLOVE) ×6 IMPLANT
GOWN PREVENTION PLUS LG XLONG (DISPOSABLE) ×8 IMPLANT
NDL SPNL 18GX3.5 QUINCKE PK (NEEDLE) ×1 IMPLANT
NEEDLE MAYO .5 CIRCLE (NEEDLE) IMPLANT
NEEDLE SPNL 18GX3.5 QUINCKE PK (NEEDLE) ×2 IMPLANT
NS IRRIG 1000ML POUR BTL (IV SOLUTION) ×2 IMPLANT
PACK LAVH (CUSTOM PROCEDURE TRAY) ×2 IMPLANT
SCALPEL HARMONIC ACE (MISCELLANEOUS) ×1 IMPLANT
SCISSORS LAP 5X35 DISP (ENDOMECHANICALS) IMPLANT
SET CYSTO W/LG BORE CLAMP LF (SET/KITS/TRAYS/PACK) IMPLANT
SET IRRIG TUBING LAPAROSCOPIC (IRRIGATION / IRRIGATOR) ×1 IMPLANT
SLEEVE ADV FIXATION 5X100MM (TROCAR) IMPLANT
SOLUTION ELECTROLUBE (MISCELLANEOUS) IMPLANT
STRIP CLOSURE SKIN 1/4X3 (GAUZE/BANDAGES/DRESSINGS) IMPLANT
SUT PLAIN 4 0 FS 2 27 (SUTURE) ×2 IMPLANT
SUT VIC AB 0 CT1 18XCR BRD8 (SUTURE) ×3 IMPLANT
SUT VIC AB 0 CT1 27 (SUTURE) ×2
SUT VIC AB 0 CT1 27XBRD ANBCTR (SUTURE) ×1 IMPLANT
SUT VIC AB 0 CT1 8-18 (SUTURE) ×6
SUT VIC AB 2-0 SH 27 (SUTURE) ×2
SUT VIC AB 2-0 SH 27XBRD (SUTURE) ×1 IMPLANT
SUT VICRYL 0 TIES 12 18 (SUTURE) ×2 IMPLANT
SUT VICRYL 0 UR6 27IN ABS (SUTURE) ×2 IMPLANT
SYR BULB IRRIGATION 50ML (SYRINGE) ×2 IMPLANT
TIP UTERINE 6.7X10CM GRN DISP (MISCELLANEOUS) ×1 IMPLANT
TOWEL OR 17X24 6PK STRL BLUE (TOWEL DISPOSABLE) ×4 IMPLANT
TRAY FOLEY CATH 14FR (SET/KITS/TRAYS/PACK) ×2 IMPLANT
TROCAR XCEL NON-BLD 11X100MML (ENDOMECHANICALS) ×2 IMPLANT
TROCAR XCEL NON-BLD 5MMX100MML (ENDOMECHANICALS) ×2 IMPLANT
TROCAR Z-THREAD FIOS 5X100MM (TROCAR) IMPLANT
WATER STERILE IRR 1000ML POUR (IV SOLUTION) ×2 IMPLANT

## 2011-07-14 NOTE — H&P (Signed)
The patient was examined.  I reviewed the proposed surgery and consent form with the patient.  The dictated history and physical is current and accurate and all questions were answered. The patient is ready to proceed with surgery and has a realistic understanding and expectation for the outcome.   Dara Lords MD, 7:09 AM 07/14/2011

## 2011-07-14 NOTE — Addendum Note (Signed)
Addendum  created 07/14/11 1356 by Orlie Pollen, CRNA   Modules edited:Notes Section

## 2011-07-14 NOTE — Anesthesia Preprocedure Evaluation (Signed)
Anesthesia Evaluation  Patient identified by MRN, date of birth, ID band Patient awake    Reviewed: Allergy & Precautions, H&P , Patient's Chart, lab work & pertinent test results  Airway Mallampati: III TM Distance: >3 FB Neck ROM: full    Dental No notable dental hx. (+) Teeth Intact   Pulmonary neg pulmonary ROS,  breath sounds clear to auscultation  Pulmonary exam normal       Cardiovascular Atrial Fibrillation + Valvular Problems/Murmurs MVP Rhythm:regular Rate:Normal     Neuro/Psych negative neurological ROS  negative psych ROS   GI/Hepatic negative GI ROS, Neg liver ROS,   Endo/Other  negative endocrine ROS  Renal/GU negative Renal ROS  negative genitourinary   Musculoskeletal   Abdominal   Peds  Hematology negative hematology ROS (+)   Anesthesia Other Findings   Reproductive/Obstetrics negative OB ROS                           Anesthesia Physical Anesthesia Plan  ASA: II  Anesthesia Plan: General ETT   Post-op Pain Management:    Induction:   Airway Management Planned:   Additional Equipment:   Intra-op Plan:   Post-operative Plan:   Informed Consent: I have reviewed the patients History and Physical, chart, labs and discussed the procedure including the risks, benefits and alternatives for the proposed anesthesia with the patient or authorized representative who has indicated his/her understanding and acceptance.     Plan Discussed with: Anesthesiologist, CRNA and Surgeon  Anesthesia Plan Comments:         Anesthesia Quick Evaluation

## 2011-07-14 NOTE — Anesthesia Postprocedure Evaluation (Signed)
Anesthesia Post Note  Patient: Carrie Velasquez  Procedure(s) Performed: Procedure(s) (LRB): LAPAROSCOPIC ASSISTED VAGINAL HYSTERECTOMY (N/A)  Anesthesia type: GA  Patient location: PACU  Post pain: Pain level controlled  Post assessment: Post-op Vital signs reviewed  Last Vitals:  Filed Vitals:   07/14/11 1000  BP: 124/90  Pulse: 84  Temp:   Resp: 28    Post vital signs: Reviewed  Level of consciousness: sedated  Complications: No apparent anesthesia complications

## 2011-07-14 NOTE — Op Note (Signed)
Carrie Velasquez October 14, 1960 [id number]   Post Operative Note   Date of surgery:  07/14/2011  Pre Op Dx: leiomyoma, menorrhagia, irregular menses, pelvic pressure, dyspareunia  Post Op Dx:  same  Procedure:  Laparoscopic assisted vaginal hysterectomy  Surgeon:  Dara Lords  Assistant:  Reynaldo Minium  Anesthesia:  General  EBL:  250 cc  Complications:  None  Specimen:  Uterus morcellated to pathology  Clinical weight 521 grams  Findings: EUA:  External BUS vagina normal. Cervix normal. Uterus bulky approximately 14 weeks size. Adnexa without gross masses.   Operative:  Anterior cul-de-sac normal. Posterior cul-de-sac normal. Uterus enlarged with multiple myomas, serosa normal. Right and left fallopian tubes normal length caliber fimbriated ends. Right and left ovaries grossly normal free and mobile no evidence of pelvic adhesions or endometriosis. Upper abdominal exam appendix grossly normal free and mobile. Liver smooth no abnormalities. Gallbladder not visualized.  Procedure:  Patient was taken to the operating room, underwent general anesthesia, placed in the low dorsal lithotomy position, received abdominal/perineal/vaginal preparation with Betadine solution, EUA performed, bladder emptied with an indwelling Foley catheter placed in sterile technique and a Hulka tenaculum was placed on the cervix for manipulation during the procedure. A time out was performed by the surgical team.  The patient was draped in the usual fashion and a transverse infra-umbilical incision was made. Using the 10 mm Optiview direct entry trocar the abdomen was directly entered under direct visualization without difficulty. The abdomen was then insufflated and right and left 5 mm suprapubic ports were then placed under direct visualization after transillumination for the vessels without difficulty. The left uterine/ovarian pedicle was identified and transected using the harmonic scalpel. The left broad  ligament and round ligament were then transected again using the harmonic scalpel. A similar procedure was carried out on the other side. Both ureters were identified along the pelvic sidewalls and were away from the operative site. The anterior vesicouterine peritoneal fold was then sharply incised using the harmonic scalpel and attention was then turned to the vaginal portion of the procedure.  Patient was placed in the high dorsal lithotomy position and the cervix was visualized with a weighted speculum, anterior lip grasped with a single-tooth tenaculum and the cervical mucosa was circumferentially injected using 1% lidocaine with 1-100,000 epinephrine solution. A total of 9 cc. The cervical mucosa was then circumferentially incised and the paracervical planes were sharply developed. The posterior cul-de-sac was sharply entered and a long weighted speculum was placed. The right and left uterosacral ligaments were identified, clamped cut and ligated using 0 Vicryl suture and tagged for future reference. The anterior cul-de-sac was then sharply and bluntly developed and ultimately entered without difficulty. The uterus was then progressively freed from its attachments through clamping, cutting and ligating of the cardinal ligaments and parametrial tissues to the level of the uterine arteries. These were identified, ligated using 0 Vicryl suture and at this point morcellation was begun and the uterus was progressively excised without difficulty. The remaining tissue pedicles were clamped cut and ligated using 0 Vicryl suture and the uterus was completely removed from the patient. The long weighted speculum was replaced with a shorter weighted speculum, the intestines packed from the cul-de-sac using a tagged tail sponge and the posterior vaginal cuff was run from uterosacral ligament to uterosacral ligament using 0 Vicryl suture in a running interlocking stitch. The pelvis was irrigated showing adequate hemostasis  and the tail sponge was removed and the vagina was closed anterior to posterior  using 0 Vicryl suture in interrupted figure-of-eight stitch. The vagina was irrigated and hemostasis was visualized. The patient was placed in the low dorsal lithotomy position and the surgical team regloved and the abdomen was reinsufflated. The pelvis was irrigated and hemostasis was visualized at the pedicles and operative sites. The gas was allowed to escape and the pelvis was reinspected under a low pressure situation again showing adequate hemostasis. The right and left 5 mm ports were then removed under direct visualization showing adequate hemostasis and the infraumbilical port was backed out under direct visualization showing adequate hemostasis and no evidence of hernia formation. All skin incisions were injected using 0.25% Marcaine, total of 6 cc and the infraumbilical port was closed using 0 Vicryl suture in an interrupted subcutaneous fascial stitch. All skin incisions were closed using 4-0 plain suture in interrupted cuticular stitch, sterile dressings were applied, the patient placed in the supine position, awakened without difficulty and taken to the recovery room good condition having tolerated the procedure well. There was clear yellow free-flowing urine in the Foley catheter and the patient received intraoperative Toradol.    Dara Lords MD, 9:35 AM 07/14/2011

## 2011-07-14 NOTE — Anesthesia Postprocedure Evaluation (Signed)
  Anesthesia Post-op Note  Patient: Carrie Velasquez  Procedure(s) Performed: Procedure(s) (LRB): LAPAROSCOPIC ASSISTED VAGINAL HYSTERECTOMY (N/A)  Patient Location: PACU and Women's Unit  Anesthesia Type: General  Level of Consciousness: awake, alert , oriented and patient cooperative  Airway and Oxygen Therapy: Patient Spontanous Breathing  Post-op Pain: none  Post-op Assessment: Post-op Vital signs reviewed, Patient's Cardiovascular Status Stable, No signs of Nausea or vomiting, Adequate PO intake and Pain level controlled  Post-op Vital Signs: Reviewed and stable  Complications: No apparent anesthesia complications

## 2011-07-14 NOTE — Progress Notes (Signed)
DOS S/P LAVH Afeb, VSS Doing well, wants discharge Drinking fluids with minimal pain Abd soft minimal tender, dressings dry Scant Vaginal bleeding reported  Clear yellow urine in foley.  Will D/C foley, ambulate, void and if continues well will D/C home later today at her choice.  If not, then in the AM.  I reviewed precautions, instructions and follow up with her and her husband.  Precautions/presciptions per AVS.

## 2011-07-14 NOTE — Discharge Instructions (Signed)
°  Postoperative Instructions Hysterectomy ° °Dr. Yacine Droz and the nursing staff have discussed postoperative instructions with you.  If you have any questions please ask them before you leave the hospital, or call Dr Cedar Ditullio’s office at [phone].   ° °We would like to emphasize the following instructions: ° ° °  Call the office to make your follow-up appointment as recommended by Dr Zykeem Bauserman (usually 2 weeks). ° °  You were given a prescription, or one was ordered for you at the pharmacy you designated.  Get that prescription filled and take the medication according to instructions. ° °  You may eat a regular diet, but slowly until you start having bowel movements. ° °  Drink plenty of water daily. ° °  Nothing in the vagina (intercourse, douching, objects of any kind) until released by Dr Jennika Ringgold. ° °  No driving for two weeks.  Wait to be cleared by Dr Kohl Polinsky at your first post op check.  Car rides (short) are ok after several days at home, as long as you are not having significant pain, but no traveling out of town. ° °  You may shower, but no baths.  Walking up and down stairs is ok.  No heavy lifting, prolonged standing, repeated bending or any “working out” until your first post op check. ° °  Rest frequently, listen to your body and do not push yourself and overdo it. ° °  Call if: ° °o Your pain medication does not seem strong enough. °o Worsening pain or abdominal bloating °o Persistent nausea or vomiting °o Difficulty with urination or bowel movements. °o Temperature of 101 degrees or higher. °o Bleeding heavier then staining (clots or period type flow). °o Incisions become red, tender or begin to drain. °o You have any questions or concerns. °

## 2011-07-14 NOTE — Progress Notes (Signed)
Pt is discharged in the care of husband. Downstairs per ambulatory. Stable.. Denies any pain or discomfort. Lapsites are clean and dry Denies excessive vaginal bleeding.

## 2011-07-14 NOTE — Transfer of Care (Signed)
Immediate Anesthesia Transfer of Care Note  Patient: Carrie Velasquez  Procedure(s) Performed: Procedure(s) (LRB): LAPAROSCOPIC ASSISTED VAGINAL HYSTERECTOMY (N/A)  Patient Location: PACU  Anesthesia Type: General  Level of Consciousness: awake, alert , oriented and patient cooperative  Airway & Oxygen Therapy: Patient Spontanous Breathing and Patient connected to nasal cannula oxygen  Post-op Assessment: Report given to PACU RN, Post -op Vital signs reviewed and stable and Patient moving all extremities X 4  Post vital signs: Reviewed and stable  Complications: No apparent anesthesia complications

## 2011-07-14 NOTE — Telephone Encounter (Signed)
Per TF rx called in to pharmacy for Lortab 5/500 1-2 tablets q 4-6 hours as needed. #25 0 refills

## 2011-07-15 ENCOUNTER — Encounter (HOSPITAL_COMMUNITY): Payer: Self-pay | Admitting: Gynecology

## 2011-07-15 NOTE — Discharge Summary (Signed)
  Carrie Velasquez July 30, 1960 [id number]   Discharge Summary  Date of Admission:  07/14/2011  Date of Discharge:  07/14/2011  Discharge Diagnosis:  Menorrhagia, dysmenorrhea, pelvic pain, dyspareunia, leiomyoma  Procedure:  Procedure(s): LAPAROSCOPIC ASSISTED VAGINAL HYSTERECTOMY  Pathology:  Accession #: [id number] Diagnosis Uterus and cervix, morcellated UTERINE CORPUS: ENDOMETRIUM: - INACTIVE ENDOMETRIUM. - NO HYPERPLASIA, ATYPIA OR MALIGNANCY IDENTIFIED. MYOMETRIUM: - LEIOMYOMATA. - NO ATYPIA OR MALIGNANCY IDENTIFIED. UTERINE CERVIX: - BENIGN TRANSFORMATION ZONE MUCOSA. - NO ATYPIA, DYSPLASIA OR MALIGNANCY IDENTIFIED.  Hospital Course:  Patient underwent uncomplicated LAVH 07/14/2011. She was discharged day of surgery ambulating well, voiding without difficulty, taking by mouth well. She was given instructions, precautions and follow up and will be seen in the office 2 weeks following discharge. Discharge pain medication of Lortab 5.0 #25 one to 2 by mouth every 6 hours when necessary pain was called to her pharmacy.    Dara Lords MD, 3:39 PM 07/15/2011

## 2011-07-21 ENCOUNTER — Other Ambulatory Visit: Payer: Self-pay

## 2011-07-21 MED ORDER — HYDROCODONE-ACETAMINOPHEN 5-500 MG PO TABS: 1.0000 | ORAL_TABLET | Freq: Four times a day (QID) | ORAL | Status: AC | PRN

## 2011-07-21 NOTE — Telephone Encounter (Signed)
RX CALLED IN

## 2011-07-23 ENCOUNTER — Telehealth: Payer: Self-pay | Admitting: *Deleted

## 2011-07-23 NOTE — Telephone Encounter (Signed)
Pt has LAVH on 4/9 and said she is doing great, pt would like to know when can she start driving again?

## 2011-07-23 NOTE — Telephone Encounter (Signed)
Pt informed with the below. 

## 2011-07-23 NOTE — Telephone Encounter (Signed)
yes

## 2011-07-24 ENCOUNTER — Ambulatory Visit (INDEPENDENT_AMBULATORY_CARE_PROVIDER_SITE_OTHER): Payer: PRIVATE HEALTH INSURANCE | Admitting: Internal Medicine

## 2011-07-24 ENCOUNTER — Encounter: Payer: Self-pay | Admitting: Gynecology

## 2011-07-24 VITALS — BP 120/78 | Temp 98.4°F | Wt 150.0 lb

## 2011-07-24 DIAGNOSIS — M62838 Other muscle spasm: Secondary | ICD-10-CM

## 2011-07-24 DIAGNOSIS — M542 Cervicalgia: Secondary | ICD-10-CM

## 2011-07-24 NOTE — Patient Instructions (Signed)
I agree that this appears to be trapezius pain and postural factors may be in play.  Get neck x-ray at the longest of which is his results  Look into integrative therapies because I think she would benefit from physical therapy .Check if they are in network and we could do referral to them.  In the meantime I would suggest a week of anti-inflammatories which would be 2 Aleve twice a day for 7-10 days or as needed.

## 2011-07-25 ENCOUNTER — Encounter: Payer: Self-pay | Admitting: Internal Medicine

## 2011-07-25 DIAGNOSIS — M62838 Other muscle spasm: Secondary | ICD-10-CM | POA: Insufficient documentation

## 2011-07-25 NOTE — Progress Notes (Signed)
  Subjective:    Patient ID: Carrie Velasquez, female    DOB: October 21, 1960, 51 y.o.   MRN: [id number]  HPI Patient comes in today for SDA for  new problem evaluation. She has had a problem with neck pain from November. No injury but somewhat sudden onset . Mostly right trapezius distribution. Pain without radiation otherwise and no weakness.  Neck has crepitus doing yoga and massage . NO meds although narcotic pain med helped which she had taken post op  Hysterectomy. ( for fibroids) Hurts to turn over in bed and wakes her up. Pain is constant but waxing and waning  Review of Systems No fever cp sob numbness weakness ue or le  No bleeding  Past history family history social history reviewed in the electronic medical record. 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 NO Kerby masses or tenderness. NEURO: oriented x 3 CN 3-12 appear intact. No focal muscle weakness or atrophy. DTRs symmetrical. Brisk  UE  Gait WNL.  Grossly non focal. No tremor or abnormal movement.    Assessment & Plan:   Neck pain  Mechanichal without alarm features but long lasting .  Trial of nsaids get plain x ray and then plan physical therapy  . If  persistent or progressive then consider other evaluation

## 2011-07-28 ENCOUNTER — Telehealth: Payer: Self-pay | Admitting: *Deleted

## 2011-07-28 DIAGNOSIS — M542 Cervicalgia: Secondary | ICD-10-CM

## 2011-07-28 NOTE — Telephone Encounter (Signed)
Please send an order tfor PT  Dx neck pain. Have her get the neck x ray in the meantime please.

## 2011-07-28 NOTE — Telephone Encounter (Signed)
Spoke with patient.  She will have her xray done tomorrow and referral request sent

## 2011-07-28 NOTE — Telephone Encounter (Signed)
Pt. Is calling to let us know that she wants to do her PT at [facility].  We need to send a   prescription, and a copy of her insurance card sent there.  The fax number is [phone].

## 2011-07-29 ENCOUNTER — Ambulatory Visit (INDEPENDENT_AMBULATORY_CARE_PROVIDER_SITE_OTHER): Payer: PRIVATE HEALTH INSURANCE | Admitting: Gynecology

## 2011-07-29 ENCOUNTER — Ambulatory Visit (INDEPENDENT_AMBULATORY_CARE_PROVIDER_SITE_OTHER)
Admission: RE | Admit: 2011-07-29 | Discharge: 2011-07-29 | Disposition: A | Discharge status: Home / Self Care | Payer: PRIVATE HEALTH INSURANCE | Source: Ambulatory Visit | Attending: Internal Medicine | Admitting: Internal Medicine

## 2011-07-29 ENCOUNTER — Encounter: Payer: Self-pay | Admitting: Gynecology

## 2011-07-29 DIAGNOSIS — Z9889 Other specified postprocedural states: Secondary | ICD-10-CM

## 2011-07-29 DIAGNOSIS — M542 Cervicalgia: Secondary | ICD-10-CM

## 2011-07-29 NOTE — Progress Notes (Addendum)
Patient presents at 2 weeks status post LAVH doing well. Pathology reviewed with her that showed:  #: [id number] Diagnosis Uterus and cervix, morcellated 521 grams UTERINE CORPUS: ENDOMETRIUM: - INACTIVE ENDOMETRIUM. - NO HYPERPLASIA, ATYPIA OR MALIGNANCY IDENTIFIED. MYOMETRIUM: - LEIOMYOMATA. - NO ATYPIA OR MALIGNANCY IDENTIFIED. UTERINE CERVIX: - BENIGN TRANSFORMATION ZONE MUCOSA. - NO ATYPIA, DYSPLASIA OR MALIGNANCY IDENTIFIED.  Exam with Sherrilyn Rist chaperone present Abdomen incision is healing nicely with several sutures still in place which were removed. Soft nontender without masses guarding rebound. Pelvic external BUS vagina with cuff healing nicely. Bimanual without masses or tenderness.  Assessment and plan: Two-week postop check status post LAVH doing well. Will resume normal activities with the exception of continued pelvic rest and follow up in 2 weeks.

## 2011-07-29 NOTE — Patient Instructions (Signed)
Follow up in 2 weeks for post op appointment

## 2011-08-04 NOTE — Progress Notes (Signed)
Quick Note:  Pt aware ______ 

## 2011-08-10 ENCOUNTER — Ambulatory Visit: Payer: Medicaid - Out of State | Attending: Internal Medicine

## 2011-08-10 DIAGNOSIS — IMO0001 Reserved for inherently not codable concepts without codable children: Secondary | ICD-10-CM | POA: Insufficient documentation

## 2011-08-10 DIAGNOSIS — M542 Cervicalgia: Secondary | ICD-10-CM | POA: Insufficient documentation

## 2011-08-11 ENCOUNTER — Encounter: Payer: Self-pay | Admitting: Gynecology

## 2011-08-11 ENCOUNTER — Ambulatory Visit (INDEPENDENT_AMBULATORY_CARE_PROVIDER_SITE_OTHER): Payer: PRIVATE HEALTH INSURANCE | Admitting: Gynecology

## 2011-08-11 DIAGNOSIS — Z9889 Other specified postprocedural states: Secondary | ICD-10-CM

## 2011-08-11 NOTE — Progress Notes (Signed)
4 week postop visit status post LAVH doing well.  Exam is chem chaperone present Abdomen incisions healed nicely. Pelvic external BUS vagina normal with cuff healing nicely. Bimanual without masses or tenderness.  Assessment and plan: Normal postop visit status post LAVH at 4 weeks. She will slowly resume all normal activities. Continue pelvic rest for 2 more weeks. Assuming she does well she'll see me end of the summer for her annual exam, sooner as needed.

## 2011-08-11 NOTE — Patient Instructions (Signed)
Continue with nothing in the vagina for 2 more weeks. Slowly resume all normal activities. Follow up at the end of summer for your annual exam.

## 2011-08-18 ENCOUNTER — Ambulatory Visit: Payer: Medicaid - Out of State | Admitting: Physical Therapy

## 2011-08-19 ENCOUNTER — Ambulatory Visit: Payer: Medicaid - Out of State | Admitting: Physical Therapy

## 2011-08-25 ENCOUNTER — Ambulatory Visit: Payer: Medicaid - Out of State

## 2011-08-28 ENCOUNTER — Ambulatory Visit: Payer: Medicaid - Out of State | Admitting: Physical Therapy

## 2011-09-01 ENCOUNTER — Ambulatory Visit: Payer: Medicaid - Out of State | Admitting: Physical Therapy

## 2011-09-04 ENCOUNTER — Ambulatory Visit: Payer: Medicaid - Out of State | Admitting: Physical Therapy

## 2011-09-09 ENCOUNTER — Ambulatory Visit: Payer: 59 | Attending: Internal Medicine | Admitting: Physical Therapy

## 2011-09-09 DIAGNOSIS — IMO0001 Reserved for inherently not codable concepts without codable children: Secondary | ICD-10-CM | POA: Insufficient documentation

## 2011-09-09 DIAGNOSIS — M542 Cervicalgia: Secondary | ICD-10-CM | POA: Insufficient documentation

## 2011-09-09 DIAGNOSIS — M2569 Stiffness of other specified joint, not elsewhere classified: Secondary | ICD-10-CM | POA: Insufficient documentation

## 2011-09-11 ENCOUNTER — Ambulatory Visit: Payer: 59 | Admitting: Physical Therapy

## 2011-11-18 ENCOUNTER — Other Ambulatory Visit: Payer: 59

## 2011-11-23 ENCOUNTER — Other Ambulatory Visit (INDEPENDENT_AMBULATORY_CARE_PROVIDER_SITE_OTHER): Payer: 59

## 2011-11-23 DIAGNOSIS — Z0000 Encounter for general adult medical examination without abnormal findings: Secondary | ICD-10-CM

## 2011-11-23 LAB — POCT URINALYSIS DIPSTICK
Glucose, UA: NEGATIVE
Leukocytes, UA: NEGATIVE
Nitrite, UA: NEGATIVE
Protein, UA: NEGATIVE
Urobilinogen, UA: 0.2

## 2011-11-23 LAB — CBC WITH DIFFERENTIAL/PLATELET
Basophils Absolute: 0 10*3/uL (ref 0.0–0.1)
Eosinophils Absolute: 0.2 10*3/uL (ref 0.0–0.7)
Hemoglobin: 13.2 g/dL (ref 12.0–15.0)
Lymphocytes Relative: 33.9 % (ref 12.0–46.0)
MCHC: 32.7 g/dL (ref 30.0–36.0)
Monocytes Absolute: 0.3 10*3/uL (ref 0.1–1.0)
Neutro Abs: 2.1 10*3/uL (ref 1.4–7.7)
RDW: 15.4 % — ABNORMAL HIGH (ref 11.5–14.6)

## 2011-11-23 LAB — BASIC METABOLIC PANEL
CO2: 26 mEq/L (ref 19–32)
Calcium: 9.4 mg/dL (ref 8.4–10.5)
Creatinine, Ser: 0.8 mg/dL (ref 0.4–1.2)
Glucose, Bld: 88 mg/dL (ref 70–99)

## 2011-11-23 LAB — HEPATIC FUNCTION PANEL
ALT: 27 U/L (ref 0–35)
Alkaline Phosphatase: 43 U/L (ref 39–117)
Total Protein: 6.4 g/dL (ref 6.0–8.3)

## 2011-11-23 LAB — LIPID PANEL
LDL Cholesterol: 106 mg/dL — ABNORMAL HIGH (ref 0–99)
VLDL: 18.2 mg/dL (ref 0.0–40.0)

## 2011-11-25 ENCOUNTER — Encounter: Payer: Self-pay | Admitting: Internal Medicine

## 2011-11-25 ENCOUNTER — Ambulatory Visit (INDEPENDENT_AMBULATORY_CARE_PROVIDER_SITE_OTHER): Payer: 59 | Admitting: Internal Medicine

## 2011-11-25 VITALS — BP 130/92 | HR 90 | Temp 98.8°F | Ht 65.75 in | Wt 150.0 lb

## 2011-11-25 DIAGNOSIS — M542 Cervicalgia: Secondary | ICD-10-CM

## 2011-11-25 DIAGNOSIS — Z136 Encounter for screening for cardiovascular disorders: Secondary | ICD-10-CM

## 2011-11-25 DIAGNOSIS — I480 Paroxysmal atrial fibrillation: Secondary | ICD-10-CM

## 2011-11-25 DIAGNOSIS — I4891 Unspecified atrial fibrillation: Secondary | ICD-10-CM

## 2011-11-25 DIAGNOSIS — Z1211 Encounter for screening for malignant neoplasm of colon: Secondary | ICD-10-CM | POA: Insufficient documentation

## 2011-11-25 DIAGNOSIS — Z0000 Encounter for general adult medical examination without abnormal findings: Secondary | ICD-10-CM

## 2011-11-25 DIAGNOSIS — R030 Elevated blood-pressure reading, without diagnosis of hypertension: Secondary | ICD-10-CM

## 2011-11-25 NOTE — Patient Instructions (Addendum)
Check your BP readings  And send copy t [place]  Decreased sodium  In diet continue healthy exercise.    Intervention  If needed.   Will refer for colon EKG is good today

## 2011-11-25 NOTE — Progress Notes (Signed)
Subjective:    Patient ID: Carrie Velasquez, female    DOB: 1960/08/05, 51 y.o.   MRN: [id number]  HPI Patient comes in today for Preventive Health Care visit  Doing well   Has never had colonoscopy Fu other issues : Neck pain  Finished 6 weeks doing better  Then back again.  Seems to be postural  Better when does exercises No recent  Af  Hx of paf with after an exercise ? Never had official consult had dr Antoine Poche do echo in th past.  None recently used to have spells max 30 minutes.   Mom had ? Af but had  Vascular disease cva pvd? No dm.  Pt has had lipoprofile in past and was good  Had hysterectomy this year for bleeding no complications  April Review of Systems Neg cp sob exrcises regulary no bleeding had hyst for bleeding fibroid in April no complications  Rest of ros neg or [place] . Past history family history social history reviewed in the electronic medical record. Outpatient Encounter Prescriptions as of 11/25/2011  Medication Sig Dispense Refill  . aspirin 81 MG tablet Take 81 mg by mouth daily.      . Calcium Carbonate-Vitamin D (CALCIUM + D PO) Take by mouth.        . Multiple Vitamin (MULTIVITAMIN) tablet Take 1 tablet by mouth daily.        . Omega-3 Fatty Acids (FISH OIL PO) Take by mouth.            Objective:   Physical Exam BP 130/92  Pulse 90  Temp 98.8 F (37.1 C) (Oral)  Ht 5' 5.75" (1.67 m)  Wt 150 lb (68.04 kg)  BMI 24.40 kg/m2  SpO2 98%  LMP 07/05/2011 Repeat BP readings 1` Physical Exam: Vital signs reviewed JXB:JYNW is a well-developed well-nourished alert cooperative  white female who appears her stated age in no acute distress.  HEENT: normocephalic atraumatic , Eyes: PERRL EOM's full, conjunctiva clear, Nares: paten,t no deformity discharge or tenderness., Ears: no deformity EAC's clear TMs with normal landmarks. Mouth: clear OP, no lesions, edema.  Moist mucous membranes. Dentition in adequate repair. NECK: supple without masses, thyromegaly or  bruits. CHEST/PULM:  Clear to auscultation and percussion breath sounds equal no wheeze , rales or rhonchi. No chest wall deformities or tenderness. CV: PMI is nondisplaced, S1 S2 no gallops, murmurs, rubs. ? Intermittent click Peripheral pulses are full without delay.No JVD .  ABDOMEN: Bowel sounds normal nontender  No guard or rebound, no hepato splenomegal no CVA tenderness.  No hernia. Extremtities:  No clubbing cyanosis or edema, no acute joint swelling or redness no focal atrophy NEURO:  Oriented x3, cranial nerves 3-12 appear to be intact, no obvious focal weakness,gait within normal limits no abnormal reflexes or asymmetrical SKIN: No acute rashes normal turgor, color, no bruising or petechiae. PSYCH: Oriented, good eye contact, no obvious depression anxiety, cognition and judgment appear normal. LN: no cervical axillary inguinal adenopathy  Lab Results  Component Value Date   WBC 4.0* 11/23/2011   HGB 13.2 11/23/2011   HCT 40.4 11/23/2011   PLT 258.0 11/23/2011   GLUCOSE 88 11/23/2011   CHOL 190 11/23/2011   TRIG 91.0 11/23/2011   HDL 65.40 11/23/2011   LDLCALC 106* 11/23/2011   ALT 27 11/23/2011   AST 34 11/23/2011   NA 142 11/23/2011   K 4.1 11/23/2011   CL 109 11/23/2011   CREATININE 0.8 11/23/2011   BUN 13 11/23/2011  CO2 26 11/23/2011   TSH 1.00 11/23/2011   EKG nsp no acute changes rate brady Reviewed  Echo 2010     Assessment & Plan:   Preventive Health Care Counseled regarding healthy nutrition, exercise, sleep, injury prevention, calcium vit d and healthy weight . Refer for colon   Borderline high diastolic    Document  At home  Dec sodium and if not controlled consider meds  Because of family hx and hx of paf.    Continue on asa  Ok to call if getting a problem and can rx with   Prn beta  blocker  Neck pain MS cause  continue Sp hysterectomy  Doing well

## 2012-01-12 ENCOUNTER — Encounter: Payer: Self-pay | Admitting: Internal Medicine

## 2012-01-12 ENCOUNTER — Encounter: Payer: Self-pay | Admitting: Gynecology

## 2012-01-12 LAB — HM MAMMOGRAPHY

## 2012-01-14 ENCOUNTER — Other Ambulatory Visit: Payer: Self-pay | Admitting: *Deleted

## 2012-01-14 ENCOUNTER — Other Ambulatory Visit: Payer: Self-pay | Admitting: Gynecology

## 2012-01-14 DIAGNOSIS — R928 Other abnormal and inconclusive findings on diagnostic imaging of breast: Secondary | ICD-10-CM

## 2012-02-01 ENCOUNTER — Ambulatory Visit (INDEPENDENT_AMBULATORY_CARE_PROVIDER_SITE_OTHER): Payer: 59 | Admitting: Gynecology

## 2012-02-01 ENCOUNTER — Encounter: Payer: Self-pay | Admitting: Gynecology

## 2012-02-01 VITALS — BP 120/80 | Ht 66.75 in | Wt 152.0 lb

## 2012-02-01 DIAGNOSIS — Z01419 Encounter for gynecological examination (general) (routine) without abnormal findings: Secondary | ICD-10-CM

## 2012-02-01 NOTE — Patient Instructions (Signed)
Follow up in one year for annual exam 

## 2012-02-01 NOTE — Progress Notes (Signed)
Carrie Velasquez [birth year] [id number]        51 y.o.  G2P2 for annual exam.  Doing well.  Past medical history,surgical history, medications, allergies, family history and social history were all reviewed and documented in the EPIC chart. ROS:  Was performed and pertinent positives and negatives are included in the history.  Exam: Sherrilyn Rist assistant Filed Vitals:   02/01/12 1015  BP: 120/80  Height: 5' 6.75" (1.695 m)  Weight: 152 lb (68.947 kg)   General appearance  Normal Skin grossly normal Head/Neck normal with no cervical or supraclavicular adenopathy thyroid normal Lungs  clear Cardiac RR, without RMG Abdominal  soft, nontender, without masses, organomegaly or hernia Breasts  examined lying and sitting without masses, retractions, discharge or axillary adenopathy. Pelvic  Ext/BUS/vagina  normal   Adnexa  Without masses or tenderness    Anus and perineum  normal   Rectovaginal  normal sphincter tone without palpated masses or tenderness.    Assessment/Plan:  51 y.o. G2P2 female for annual exam.   1. 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 Doing well without menopausal symptoms.  Continue to monitor. 2. Mammography. Just had done. Follow up for annual mammography. SBE monthly reviewed. 3. Pap smear. No Pap smear done today. Reports Pap smear last year at Dr. Vance Gather office. 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 Options to stop screening altogether as she is status post hysterectomy for benign indications versus less frequent screening options reviewed and we'll readdress this on an annual basis. 4. Colonoscopy. Patient's going to schedule after the first of the year. 5. Health maintenance. We'll do blood work today as it's all done through Dr. Rosezella [place] office. Follow up one year, sooner as needed.    Dara Lords MD, 10:33 AM 02/01/2012

## 2012-02-17 ENCOUNTER — Encounter: Payer: Self-pay | Admitting: Family

## 2012-02-17 ENCOUNTER — Ambulatory Visit (INDEPENDENT_AMBULATORY_CARE_PROVIDER_SITE_OTHER): Payer: 59 | Admitting: Family

## 2012-02-17 VITALS — BP 134/90 | Temp 98.1°F | Wt 153.0 lb

## 2012-02-17 DIAGNOSIS — N390 Urinary tract infection, site not specified: Secondary | ICD-10-CM

## 2012-02-17 DIAGNOSIS — I10 Essential (primary) hypertension: Secondary | ICD-10-CM

## 2012-02-17 LAB — POCT URINALYSIS DIPSTICK
Blood, UA: NEGATIVE
Nitrite, UA: NEGATIVE
Protein, UA: NEGATIVE
Urobilinogen, UA: 0.2
pH, UA: 6

## 2012-02-17 MED ORDER — METOPROLOL TARTRATE 25 MG PO TABS: 25.0000 mg | ORAL_TABLET | Freq: Two times a day (BID) | ORAL | Status: DC

## 2012-02-17 NOTE — Patient Instructions (Addendum)

## 2012-02-17 NOTE — Progress Notes (Signed)
Subjective:    Patient ID: Carrie Velasquez, female    DOB: 1960-08-10, 51 y.o.   MRN: [id number]  HPI  51 year old white female, nonsmoker, is in today with c/o right low back pain that she rates a 4/10, worsen with movement. She works out on a daily basis typically has not worked out in several days. She is concerned that she may have a UTI.  Patient has a history of intermittent atrial fibrillation currently being managed with ASA. She has see cardiology in the past with no further recommendation.  She has been seeing a raise in her systolic blood pressure lately, usually in the 80-90's systolically.   Review of Systems  Constitutional: Negative.   HENT: Negative.   Cardiovascular: Positive for palpitations. Negative for chest pain and leg swelling.       Occasionaly palpitations r/t brief episodes of afib.   Gastrointestinal: Negative.   Genitourinary: Negative.  Negative for urgency and frequency.  Musculoskeletal: Positive for back pain.  Neurological: Negative.   Hematological: Negative.   Psychiatric/Behavioral: Negative.    Past Medical History  Diagnosis Date  . Fibroid   . Mitral valve prolapse   . Atrial fibrillation     paroxismal , exercise triggered, none x 2 yrs    History   Social History  . Marital Status: Married    Spouse Name: N/A    Number of Children: N/A  . Years of Education: N/A   Occupational History  . Not on file.   Social History Main Topics  . Smoking status: Former Games developer  . Smokeless tobacco: Never Used     Comment: quit in age 24's  . Alcohol Use: 0.5 oz/week    1 drink(s) per week     Comment: occassional  . Drug Use: No  . Sexually Active: Yes    Birth Control/ Protection: Surgical     Comment: husband vasectomy   Other Topics Concern  . 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 No past  surgeries   . Laparoscopic assisted vaginal hysterectomy 07/14/2011    Procedure: LAPAROSCOPIC ASSISTED VAGINAL HYSTERECTOMY;  Surgeon: Dara Lords, MD;  Location: [facility];  Service: Gynecology;  Laterality: N/A;    Family History  Problem Relation Age of Onset  . Stroke Mother     vascular disease died age 81  . Heart disease Mother   . Hypertension Father   . Diabetes Sister   . Alcohol abuse Sister   . ADD / ADHD Daughter     LD.    No Known Allergies  Current Outpatient Prescriptions on File Prior to Visit  Medication Sig Dispense Refill  . aspirin 81 MG tablet Take 81 mg by mouth daily.      . Calcium Carbonate-Vitamin D (CALCIUM + D PO) Take by mouth.        . Multiple Vitamin (MULTIVITAMIN) tablet Take 1 tablet by mouth daily.        . Omega-3 Fatty Acids (FISH OIL PO) Take by mouth.      . metoprolol tartrate (LOPRESSOR) 25 MG tablet Take 1 tablet (25 mg total) by mouth 2 (two) times daily.  60 tablet  3  . [DISCONTINUED] phentermine 37.5 MG capsule Take 37.5 mg by mouth every morning.         BP 134/90  Temp  98.1 F (36.7 C) (Oral)  Wt 153 lb (69.4 kg)  LMP 03/31/2013chart    Objective:   Physical Exam  Constitutional: She is oriented to person, place, and time. She appears well-developed and well-nourished.  Neck: Normal range of motion. Neck supple.  Cardiovascular: Normal rate, regular rhythm and normal heart sounds.   Pulmonary/Chest: Effort normal and breath sounds normal.  Abdominal: Soft. Bowel sounds are normal.  Musculoskeletal: She exhibits no edema.       Minimal tenderness to palpation of the right lower back. Pain with rotation. No pain with flexion and extension.   Neurological: She is alert and oriented to person, place, and time.  Skin: Skin is warm and dry.  Psychiatric: She has a normal mood and affect.          Assessment & Plan:  Assessment: Low Back Pain, Hypertension  Plan: Lopressor 25mg  twice a day for blood pressure  control. Urine culture sent. Patient will call with blood pressure readings. Call the office with any questions or concerns.

## 2012-05-05 ENCOUNTER — Encounter: Payer: Self-pay | Admitting: Internal Medicine

## 2012-05-05 ENCOUNTER — Ambulatory Visit (AMBULATORY_SURGERY_CENTER): Payer: 59 | Admitting: *Deleted

## 2012-05-05 VITALS — Ht 67.0 in | Wt 158.0 lb

## 2012-05-05 DIAGNOSIS — Z1211 Encounter for screening for malignant neoplasm of colon: Secondary | ICD-10-CM

## 2012-05-05 MED ORDER — NA SULFATE-K SULFATE-MG SULF 17.5-3.13-1.6 GM/177ML PO SOLN: ORAL | Status: DC

## 2012-05-19 ENCOUNTER — Encounter: Payer: 59 | Admitting: Internal Medicine

## 2012-05-27 ENCOUNTER — Encounter: Payer: 59 | Admitting: Internal Medicine

## 2012-06-02 ENCOUNTER — Encounter: Payer: Self-pay | Admitting: Internal Medicine

## 2012-06-02 ENCOUNTER — Ambulatory Visit (AMBULATORY_SURGERY_CENTER): Payer: 59 | Admitting: Internal Medicine

## 2012-06-02 VITALS — BP 141/97 | HR 75 | Temp 96.7°F | Resp 18 | Ht 67.0 in | Wt 158.0 lb

## 2012-06-02 DIAGNOSIS — Z1211 Encounter for screening for malignant neoplasm of colon: Secondary | ICD-10-CM

## 2012-06-02 DIAGNOSIS — K5730 Diverticulosis of large intestine without perforation or abscess without bleeding: Secondary | ICD-10-CM

## 2012-06-02 MED ORDER — SODIUM CHLORIDE 0.9 % IV SOLN: 500.0000 mL | INTRAVENOUS | Status: DC

## 2012-06-02 NOTE — Patient Instructions (Addendum)
No polyps or cancer! Great prep also!  You do have rare sigmoid diverticulosis.  Next routine colonoscopy in about 10 years - 2024.  Thank you for choosing me and [facility].  Iva Boop, MD, FACG  YOU HAD AN ENDOSCOPIC PROCEDURE TODAY AT THE [facility]: Refer to the procedure report that was given to you for any specific questions about what was found during the examination.  If the procedure report does not answer your questions, please call your gastroenterologist to clarify.  If you requested that your care partner not be given the details of your procedure findings, then the procedure report has been included in a sealed envelope for you to review at your convenience later.  YOU SHOULD EXPECT: Some feelings of bloating in the abdomen. Passage of more gas than usual.  Walking can help get rid of the air that was put into your GI tract during the procedure and reduce the bloating. If you had a lower endoscopy (such as a colonoscopy or flexible sigmoidoscopy) you may notice spotting of blood in your stool or on the toilet paper. If you underwent a bowel prep for your procedure, then you may not have a normal bowel movement for a few days.  DIET: Your first meal following the procedure should be a light meal and then it is ok to progress to your normal diet.  A half-sandwich or bowl of soup is an example of a good first meal.  Heavy or fried foods are harder to digest and may make you feel nauseous or bloated.  Likewise meals heavy in dairy and vegetables can cause extra gas to form and this can also increase the bloating.  Drink plenty of fluids but you should avoid alcoholic beverages for 24 hours.  ACTIVITY: Your care partner should take you home directly after the procedure.  You should plan to take it easy, moving slowly for the rest of the day.  You can resume normal activity the day after the procedure however you should NOT DRIVE or use heavy machinery for 24  hours (because of the sedation medicines used during the test).    SYMPTOMS TO REPORT IMMEDIATELY: A gastroenterologist can be reached at any hour.  During normal business hours, 8:30 AM to 5:00 PM Monday through Friday, call [phone].  After hours and on weekends, please call the GI answering service at [phone] who will take a message and have the physician on call contact you.   Following lower endoscopy (colonoscopy or flexible sigmoidoscopy):  Excessive amounts of blood in the stool  Significant tenderness or worsening of abdominal pains  Swelling of the abdomen that is new, acute  Fever of 100F or higher  FOLLOW UP: If any biopsies were taken you will be contacted by phone or by letter within the next 1-3 weeks.  Call your gastroenterologist if you have not heard about the biopsies in 3 weeks.  Our staff will call the home number listed on your records the next business day following your procedure to check on you and address any questions or concerns that you may have at that time regarding the information given to you following your procedure. This is a courtesy call and so if there is no answer at the home number and we have not heard from you through the emergency physician on call, we will assume that you have returned to your regular daily activities without incident.  SIGNATURES/CONFIDENTIALITY: You and/or your care partner have signed paperwork  which will be entered into your electronic medical record.  These signatures attest to the fact that that the information above on your After Visit Summary has been reviewed and is understood.  Full responsibility of the confidentiality of this discharge information lies with you and/or your care-partner.  Diverticulosis-handout given  Repeat colonoscopy in 10 years

## 2012-06-02 NOTE — Op Note (Signed)
[facility] [address] [place], [address]   COLONOSCOPY PROCEDURE REPORT  PATIENT: Carrie, Velasquez  MR#: [id number] BIRTHDATE: 02-Jan-1961 , 51  yrs. old GENDER: Female ENDOSCOPIST: Iva Boop, MD, [facility] REFERRED XB:JYNWG Lonie Peak, M.D. PROCEDURE DATE:  06/02/2012 PROCEDURE:   Colonoscopy, screening ASA CLASS:   Class II INDICATIONS:average risk screening. MEDICATIONS: propofol (Diprivan) 400mg  IV, MAC sedation, administered by CRNA, and These medications were titrated to patient response per physician's verbal order  DESCRIPTION OF PROCEDURE:   After the risks benefits and alternatives of the procedure were thoroughly explained, informed consent was obtained.  A digital rectal exam revealed no abnormalities of the rectum.   The LB [id number]  endoscope was introduced through the anus and advanced to the cecum, which was identified by both the appendix and ileocecal valve. No adverse events experienced.   The quality of the prep was Suprep excellent The instrument was then slowly withdrawn as the colon was fully examined.      COLON FINDINGS: There was mild scattered diverticulosis noted in the sigmoid colon.   The colon mucosa was otherwise normal.   A right colon retroflexion was performed.  Retroflexed views revealed no abnormalities. The time to cecum=5 minutes 41 seconds.  Withdrawal time=9 minutes 39 seconds.  The scope was withdrawn and the procedure completed. COMPLICATIONS: There were no complications.  ENDOSCOPIC IMPRESSION: 1.   There was mild diverticulosis noted in the sigmoid colon 2.   The colon mucosa was otherwise normal - excellent prep  RECOMMENDATIONS: Repeat colonoscopy 10 years.   eSigned:  Iva Boop, MD, [facility] 06/02/2012 11:21 AM   cc: Madelin Headings, MD and The Patient

## 2012-06-02 NOTE — Progress Notes (Signed)
Patient did not experience any of the following events: a burn prior to discharge; a fall within the facility; wrong site/side/patient/procedure/implant event; or a hospital transfer or hospital admission upon discharge from the facility. (G8907) Patient did not have preoperative order for IV antibiotic SSI prophylaxis. (G8918)  

## 2012-06-03 ENCOUNTER — Telehealth: Payer: Self-pay | Admitting: *Deleted

## 2012-06-03 NOTE — Telephone Encounter (Signed)
Left message that we called for f/u 

## 2012-08-09 ENCOUNTER — Other Ambulatory Visit: Payer: Self-pay | Admitting: Internal Medicine

## 2012-08-09 ENCOUNTER — Encounter: Payer: Self-pay | Admitting: Internal Medicine

## 2012-08-09 ENCOUNTER — Ambulatory Visit (INDEPENDENT_AMBULATORY_CARE_PROVIDER_SITE_OTHER): Payer: 59 | Admitting: Internal Medicine

## 2012-08-09 VITALS — BP 120/80 | HR 77 | Temp 98.7°F | Wt 156.0 lb

## 2012-08-09 DIAGNOSIS — I4891 Unspecified atrial fibrillation: Secondary | ICD-10-CM

## 2012-08-09 DIAGNOSIS — R030 Elevated blood-pressure reading, without diagnosis of hypertension: Secondary | ICD-10-CM

## 2012-08-09 DIAGNOSIS — I480 Paroxysmal atrial fibrillation: Secondary | ICD-10-CM

## 2012-08-09 MED ORDER — METOPROLOL TARTRATE 25 MG PO TABS: ORAL_TABLET | ORAL | Status: DC

## 2012-08-09 NOTE — Patient Instructions (Addendum)
Can do log  BP readings and send to [place] and then make decision about using acei.   Metoprolol as needed as we discussed .

## 2012-08-09 NOTE — Progress Notes (Signed)
Chief Complaint  Patient presents with  . Follow-up    HPI:  Patient comes in today for followup of blood pressure medical management. She was seen by nurse practitioner in the fall for elevated blood pressure readings mild and placed on metoprolol 25 mg twice a day. Because she has a history of intermittent A. fib. Since that time she's monitor for blood pressure readings most recently her diastolics may be over 90. She is doing heart healthy diet and exercising.   She ran out of metoprolol about a week ago. No palpitations and feels okay. Father had a history of hypertension trying to be heart healthy.  ROS: See pertinent positives and negatives per HPI. No chest pain shortness of breath other new cardiovascular symptoms busy at work  Past Medical History  Diagnosis Date  . Fibroid   . Mitral valve prolapse   . Atrial fibrillation     paroxismal , exercise triggered, none x 2 yrs  . Hypertension     Family History  Problem Relation Age of Onset  . Stroke Mother     vascular disease died age 67  . Heart disease Mother   . Hypertension Father   . Diabetes Sister   . Alcohol abuse Sister   . ADD / ADHD Daughter     LD.  Marland Kitchen Colon cancer Neg Hx   . Stomach cancer Neg Hx     History   Social History  . Marital Status: Married    Spouse Name: N/A    Number of Children: N/A  . Years of Education: N/A   Social History Main Topics  . Smoking status: Former Smoker    Quit date: 05/06/1995  . Smokeless tobacco: Never Used     Comment: quit in age 24's  . Alcohol Use: 0.5 oz/week    1 drink(s) per week     Comment: occassional  . Drug Use: No  . Sexually Active: Yes    Birth Control/ Protection: Surgical     Comment: husband vasectomy   Other Topics Concern  . None   Social History Narrative   Not abstracted   Refer to  [employer]ity   Married 2 children   Nurse practictioner   Working [employer]    Outpatient Encounter Prescriptions as of 08/09/2012  Medication Sig  Dispense Refill  . aspirin 81 MG tablet Take 81 mg by mouth daily.      . metoprolol tartrate (LOPRESSOR) 25 MG tablet As needed for tachycardia  30 tablet  1  . Multiple Vitamin (MULTIVITAMIN) tablet Take 1 tablet by mouth daily.        . Omega-3 Fatty Acids (FISH OIL PO) Take by mouth.      . [DISCONTINUED] metoprolol tartrate (LOPRESSOR) 25 MG tablet Take 1 tablet (25 mg total) by mouth 2 (two) times daily.  60 tablet  3  . Calcium Carbonate-Vitamin D (CALCIUM + D PO) Take by mouth.         No facility-administered encounter medications on file as of 08/09/2012.    EXAM:  BP 120/80  Pulse 77  Temp(Src) 98.7 F (37.1 C) (Oral)  Wt 156 lb (70.761 kg)  BMI 24.43 kg/m2  SpO2 98%  LMP 07/05/2011  Body mass index is 24.43 kg/(m^2).  GENERAL: vitals reviewed and listed above, alert, oriented, appears well hydrated and in no acute distress  HEENT: atraumatic, conjunctiva  clear, no obvious abnormalities on inspection of external nose and ears  NECK: no obvious masses on inspection  palpation  LUNGS: clear to auscultation bilaterally, no wheezes, rales or rhonchi, good air movement CV: HRRR, no clubbing cyanosis or  peripheral edema nl cap refill  MS: moves all extremities without noticeable focal  abnormality  PSYCH: pleasant and cooperative, no obvious depression or anxiety  ASSESSMENT AND PLAN:  Discussed the following assessment and plan:  Elevated blood pressure reading - Off medicine for week lifestyle interventions monitor consider adding back an ACE inhibitor and using the beta blocker as needed  PAF (paroxysmal atrial fibrillation)  hx of  nl echo  - Quiescent prescription for metoprolol 25 to use as needed twice a day Patient can cancel her appointment that's on the schedule for 2 weeks from now get her readings decide whether we will add an ACE inhibitor and then followup a month after beginning medication. Otherwise yearly visit -Patient advised to return or notify health  care team  if symptoms worsen or persist or new concerns arise.  Patient Instructions  Can do log  BP readings and send to [place] and then make decision about using acei.   Metoprolol as needed as we discussed .   Neta Mends. Davonte Siebenaler M.D.

## 2012-08-23 ENCOUNTER — Ambulatory Visit: Payer: 59 | Admitting: Internal Medicine

## 2012-08-26 ENCOUNTER — Encounter: Payer: Self-pay | Admitting: Internal Medicine

## 2013-01-24 ENCOUNTER — Encounter: Payer: Self-pay | Admitting: Gynecology

## 2013-01-24 ENCOUNTER — Ambulatory Visit (INDEPENDENT_AMBULATORY_CARE_PROVIDER_SITE_OTHER): Payer: 59

## 2013-01-24 DIAGNOSIS — Z23 Encounter for immunization: Secondary | ICD-10-CM

## 2013-01-31 ENCOUNTER — Encounter: Payer: Self-pay | Admitting: Internal Medicine

## 2013-02-01 ENCOUNTER — Encounter: Payer: 59 | Admitting: Gynecology

## 2013-03-08 ENCOUNTER — Ambulatory Visit (INDEPENDENT_AMBULATORY_CARE_PROVIDER_SITE_OTHER): Payer: 59 | Admitting: Gynecology

## 2013-03-08 ENCOUNTER — Encounter: Payer: Self-pay | Admitting: Gynecology

## 2013-03-08 VITALS — BP 120/74 | Ht 66.0 in | Wt 152.0 lb

## 2013-03-08 DIAGNOSIS — Z01419 Encounter for gynecological examination (general) (routine) without abnormal findings: Secondary | ICD-10-CM

## 2013-03-08 LAB — CBC WITH DIFFERENTIAL/PLATELET
Basophils Absolute: 0 10*3/uL (ref 0.0–0.1)
Basophils Relative: 1 % (ref 0–1)
Eosinophils Absolute: 0.1 10*3/uL (ref 0.0–0.7)
Lymphs Abs: 1.1 10*3/uL (ref 0.7–4.0)
MCH: 30 pg (ref 26.0–34.0)
Neutrophils Relative %: 62 % (ref 43–77)
Platelets: 304 10*3/uL (ref 150–400)
RBC: 4.5 MIL/uL (ref 3.87–5.11)

## 2013-03-08 LAB — COMPREHENSIVE METABOLIC PANEL
ALT: 15 U/L (ref 0–35)
AST: 18 U/L (ref 0–37)
Alkaline Phosphatase: 47 U/L (ref 39–117)
Sodium: 137 mEq/L (ref 135–145)
Total Bilirubin: 0.6 mg/dL (ref 0.3–1.2)
Total Protein: 6.4 g/dL (ref 6.0–8.3)

## 2013-03-08 LAB — LIPID PANEL
Cholesterol: 210 mg/dL — ABNORMAL HIGH (ref 0–200)
HDL: 75 mg/dL (ref >39)
LDL Cholesterol: 120 mg/dL — ABNORMAL HIGH (ref 0–99)
Total CHOL/HDL Ratio: 2.8 Ratio
VLDL: 15 mg/dL (ref 0–40)

## 2013-03-08 LAB — FOLLICLE STIMULATING HORMONE: FSH: 9.6 m[IU]/mL

## 2013-03-08 LAB — TSH: TSH: 1.109 u[IU]/mL (ref 0.350–4.500)

## 2013-03-08 NOTE — Patient Instructions (Signed)
Follow up in one year, sooner as needed. 

## 2013-03-08 NOTE — Progress Notes (Signed)
Carrie Velasquez September 10, 1960 [id number]        52 y.o.  G2P2 for annual exam.  Doing well without complaints.  Past medical history,surgical history, problem list, medications, allergies, family history and social history were all reviewed and documented in the EPIC chart.  ROS:  Performed and pertinent positives and negatives are included in the history, assessment and plan .  Exam: Kim assistant Filed Vitals:   03/08/13 0849  BP: 120/74  Height: 5\' 6"  (1.676 m)  Weight: 152 lb (68.947 kg)   General appearance  Normal Skin grossly normal Head/Neck normal with no cervical or supraclavicular adenopathy thyroid normal Lungs  clear Cardiac RR, without RMG Abdominal  soft, nontender, without masses, organomegaly or hernia Breasts  examined lying and sitting without masses, retractions, discharge or axillary adenopathy. Pelvic  Ext/BUS/vagina  Normal   Adnexa  Without masses or tenderness    Anus and perineum  Normal   Rectovaginal  Normal sphincter tone without palpated masses or tenderness.    Assessment/Plan:  52 y.o. G2P2 female for annual exam.   1. History LAVH for leiomyoma/menorrhagia 2013. Doing well without complaints. No signs or symptoms of menopause. Check baseline FSH. 2. Pap smear reported 2012. No Pap smear done today. History of dysplasia a number of years ago with no treatment or colposcopy but apparently just repeated the Pap smear and it was normal and she has had normal Pap smears since then. Options to stop screening altogether or less frequent screening intervals reviewed. Will readdress on an annual basis. 3. Mammography 01/2013. Continue with annual mammography. SBE monthly reviewed. 4. Colonoscopy 2014. Repeat at their recommended interval. 5. DEXA. We'll plan further into the 50s to schedule. Increase calcium vitamin D. reviewed. Check vitamin D level today. 6. Health maintenance. Baseline CBC comprehensive metabolic panel lipid profile urinalysis TSH vitamin  D FSH ordered. Followup in one year, sooner as needed.   Note: This document was prepared with digital dictation and possible smart phrase technology. Any transcriptional errors that result from this process are unintentional.   Dara Lords MD, 9:48 AM 03/08/2013

## 2013-03-09 ENCOUNTER — Other Ambulatory Visit: Payer: Self-pay | Admitting: Gynecology

## 2013-03-09 DIAGNOSIS — E7800 Pure hypercholesterolemia, unspecified: Secondary | ICD-10-CM

## 2013-03-09 LAB — URINALYSIS W MICROSCOPIC + REFLEX CULTURE
Bilirubin Urine: NEGATIVE
Glucose, UA: NEGATIVE mg/dL
Hgb urine dipstick: NEGATIVE
Ketones, ur: NEGATIVE mg/dL
Protein, ur: NEGATIVE mg/dL
Urobilinogen, UA: 0.2 mg/dL (ref 0.0–1.0)

## 2013-03-09 LAB — VITAMIN D 25 HYDROXY (VIT D DEFICIENCY, FRACTURES): Vit D, 25-Hydroxy: 38 ng/mL (ref 30–89)

## 2014-02-05 ENCOUNTER — Encounter: Payer: Self-pay | Admitting: Gynecology

## 2014-03-09 ENCOUNTER — Encounter: Payer: 59 | Admitting: Gynecology

## 2014-05-08 ENCOUNTER — Encounter: Payer: Self-pay | Admitting: Gynecology

## 2015-05-30 ENCOUNTER — Encounter: Payer: Self-pay | Admitting: Gynecology

## 2015-05-31 ENCOUNTER — Ambulatory Visit (INDEPENDENT_AMBULATORY_CARE_PROVIDER_SITE_OTHER): Payer: BLUE CROSS/BLUE SHIELD | Admitting: Gynecology

## 2015-05-31 ENCOUNTER — Other Ambulatory Visit (HOSPITAL_COMMUNITY)
Admission: RE | Admit: 2015-05-31 | Discharge: 2015-05-31 | Disposition: A | Discharge status: Home / Self Care | Payer: BLUE CROSS/BLUE SHIELD | Source: Ambulatory Visit | Attending: Gynecology | Admitting: Gynecology

## 2015-05-31 ENCOUNTER — Encounter: Payer: Self-pay | Admitting: Gynecology

## 2015-05-31 VITALS — BP 116/74 | Ht 66.0 in | Wt 152.0 lb

## 2015-05-31 DIAGNOSIS — Z1321 Encounter for screening for nutritional disorder: Secondary | ICD-10-CM | POA: Diagnosis not present

## 2015-05-31 DIAGNOSIS — Z13220 Encounter for screening for lipoid disorders: Secondary | ICD-10-CM

## 2015-05-31 DIAGNOSIS — Z01419 Encounter for gynecological examination (general) (routine) without abnormal findings: Secondary | ICD-10-CM | POA: Insufficient documentation

## 2015-05-31 DIAGNOSIS — Z1329 Encounter for screening for other suspected endocrine disorder: Secondary | ICD-10-CM

## 2015-05-31 LAB — LIPID PANEL
CHOL/HDL RATIO: 2.6 ratio (ref <=5.0)
CHOLESTEROL: 213 mg/dL — AB (ref 125–200)
HDL: 82 mg/dL (ref >=46)
LDL Cholesterol: 81 mg/dL (ref <130)
Triglycerides: 252 mg/dL — ABNORMAL HIGH (ref <150)
VLDL: 50 mg/dL — AB (ref <30)

## 2015-05-31 LAB — CBC WITH DIFFERENTIAL/PLATELET
BASOS ABS: 0.1 10*3/uL (ref 0.0–0.1)
BASOS PCT: 1 % (ref 0–1)
EOS ABS: 0.1 10*3/uL (ref 0.0–0.7)
EOS PCT: 2 % (ref 0–5)
HCT: 41.8 % (ref 36.0–46.0)
Hemoglobin: 14.2 g/dL (ref 12.0–15.0)
Lymphocytes Relative: 22 % (ref 12–46)
Lymphs Abs: 1.5 10*3/uL (ref 0.7–4.0)
MCH: 30.7 pg (ref 26.0–34.0)
MCHC: 34 g/dL (ref 30.0–36.0)
MCV: 90.5 fL (ref 78.0–100.0)
MPV: 9.5 fL (ref 8.6–12.4)
Monocytes Absolute: 0.6 10*3/uL (ref 0.1–1.0)
Monocytes Relative: 9 % (ref 3–12)
Neutro Abs: 4.4 10*3/uL (ref 1.7–7.7)
Neutrophils Relative %: 66 % (ref 43–77)
PLATELETS: 303 10*3/uL (ref 150–400)
RBC: 4.62 MIL/uL (ref 3.87–5.11)
RDW: 13.2 % (ref 11.5–15.5)
WBC: 6.7 10*3/uL (ref 4.0–10.5)

## 2015-05-31 LAB — COMPREHENSIVE METABOLIC PANEL
ALK PHOS: 50 U/L (ref 33–130)
ALT: 13 U/L (ref 6–29)
AST: 17 U/L (ref 10–35)
Albumin: 4.3 g/dL (ref 3.6–5.1)
BUN: 17 mg/dL (ref 7–25)
CHLORIDE: 103 mmol/L (ref 98–110)
CO2: 19 mmol/L — ABNORMAL LOW (ref 20–31)
Calcium: 9.4 mg/dL (ref 8.6–10.4)
Creat: 0.8 mg/dL (ref 0.50–1.05)
GLUCOSE: 80 mg/dL (ref 65–99)
POTASSIUM: 4.3 mmol/L (ref 3.5–5.3)
SODIUM: 137 mmol/L (ref 135–146)
TOTAL PROTEIN: 6.5 g/dL (ref 6.1–8.1)
Total Bilirubin: 0.5 mg/dL (ref 0.2–1.2)

## 2015-05-31 NOTE — Progress Notes (Signed)
Carrie Velasquez Sep 13, 1960 [id number]        55 y.o.  G2P2  for annual exam.  Doing well without complaints  Past medical history,surgical history, problem list, medications, allergies, family history and social history were all reviewed and documented as reviewed in the EPIC chart.  ROS:  Performed with pertinent positives and negatives included in the history, assessment and plan.   Additional significant findings :  none   Exam: Carrie Velasquez assistant Filed Vitals:   05/31/15 1532  BP: 116/74  Height: 5\' 6"  (1.676 m)  Weight: 152 lb (68.947 kg)   General appearance:  Normal affect, orientation and appearance. Skin: Grossly normal HEENT: Without gross lesions.  No cervical or supraclavicular adenopathy. Thyroid normal.  Lungs:  Clear without wheezing, rales or rhonchi Cardiac: RR, without RMG Abdominal:  Soft, nontender, without masses, guarding, rebound, organomegaly or hernia Breasts:  Examined lying and sitting without masses, retractions, discharge or axillary adenopathy. Pelvic:  Ext/BUS/vagina normal. Pap smear done  Adnexa without masses or tenderness    Anus and perineum normal   Rectovaginal normal sphincter tone without palpated masses or tenderness.    Assessment/Plan:  55 y.o. G2P2 female for annual exam.   1. LAVH for leiomyoma/menorrhagia 2013. Doing well without complaints. Occasional hot flash.  Continue to monitor and follow up if significant symptoms develops. 2. Pap smear reported 2012. Pap smear done today. History of dysplasia number of years ago with no treatment or colposcopy but apparently repeat Pap smear with clearing. 3. Mammography 05/2015. Continue with annual mammography when due. S/P chemotherapy you. 4. Colonoscopy 2014. Repeat at their recommended interval. 5. DEXA heel done number of years ago. Will plan further into the menopause. Check vitamin D level today. 6. Health maintenance. Baseline labs to include CBC, comprehensive metabolic panel,  lipid profile, TSH, vitamin D and urinalysis done. Discussed offered and patient declined hepatitis C screen. Follow up in one year, sooner as needed.   Carrie Auerbach MD, 3:56 PM 05/31/2015

## 2015-05-31 NOTE — Patient Instructions (Signed)

## 2015-05-31 NOTE — Addendum Note (Signed)
Addended by: Nelva Nay on: 05/31/2015 04:02 PM   Modules accepted: Orders

## 2015-06-01 LAB — URINALYSIS W MICROSCOPIC + REFLEX CULTURE
BACTERIA UA: NONE SEEN [HPF]
BILIRUBIN URINE: NEGATIVE
CASTS: NONE SEEN [LPF]
Glucose, UA: NEGATIVE
Hgb urine dipstick: NEGATIVE
KETONES UR: NEGATIVE
Leukocytes, UA: NEGATIVE
Nitrite: NEGATIVE
PROTEIN: NEGATIVE
RBC / HPF: NONE SEEN RBC/HPF (ref <=2)
Specific Gravity, Urine: 1.015 (ref 1.001–1.035)
WBC UA: NONE SEEN WBC/HPF (ref <=5)
Yeast: NONE SEEN [HPF]
pH: 5.5 (ref 5.0–8.0)

## 2015-06-01 LAB — TSH: TSH: 0.81 m[IU]/L

## 2015-06-01 LAB — VITAMIN D 25 HYDROXY (VIT D DEFICIENCY, FRACTURES): VIT D 25 HYDROXY: 24 ng/mL — AB (ref 30–100)

## 2015-06-03 ENCOUNTER — Encounter: Payer: Self-pay | Admitting: Gynecology

## 2015-06-04 LAB — CYTOLOGY - PAP

## 2015-06-05 ENCOUNTER — Encounter: Payer: Self-pay | Admitting: Gynecology

## 2015-06-05 ENCOUNTER — Other Ambulatory Visit: Payer: Self-pay | Admitting: Gynecology

## 2015-06-05 DIAGNOSIS — E7800 Pure hypercholesterolemia, unspecified: Secondary | ICD-10-CM

## 2015-06-05 DIAGNOSIS — E559 Vitamin D deficiency, unspecified: Secondary | ICD-10-CM

## 2015-10-02 ENCOUNTER — Telehealth: Payer: Self-pay | Admitting: *Deleted

## 2015-10-02 NOTE — Telephone Encounter (Signed)
Pt called c/o increased hot flashes day and, lives [place] and half away. 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 Aware that office visit would be best, but due to work and travel time now is difficult. Please advise

## 2015-10-03 NOTE — Telephone Encounter (Signed)
Call patient today and discussed her worsening hot flushes and sweats. Had been using an OTC products primarily black cohosh which seem to be helping but now does not. Does not want to try HRT at this time but wants to try a different OTC products. I suggested a soy based product to see if this does not help. I did review the issues of HRT with estrogen alone as she is status post hysterectomy and the risks benefits to include an increased risk of fibrosis such as stroke heart attack DVT. I reviewed the [facility] study results as far as breast cancer and estrogen alone showing no significant increased risk over placebo. I reviewed the first pass effect issues and the benefit of a transdermal approach. At this point the patient's can try additional OTC products but may call if she decides she wants to pursue and estrogen patch.

## 2015-12-19 ENCOUNTER — Telehealth: Payer: Self-pay | Admitting: *Deleted

## 2015-12-19 NOTE — Telephone Encounter (Signed)
Pt called to follow up with telephone encounter 10/02/15 states OTC is not helping bring any relief at all hot flashes are unbearable,pt wishes to start HRT. Please advise

## 2015-12-19 NOTE — Telephone Encounter (Signed)
Pt informed, will call back tomorrow to schedule.

## 2015-12-19 NOTE — Telephone Encounter (Signed)
Office visit to discuss options. 

## 2016-01-09 ENCOUNTER — Ambulatory Visit (INDEPENDENT_AMBULATORY_CARE_PROVIDER_SITE_OTHER): Payer: BLUE CROSS/BLUE SHIELD | Admitting: Gynecology

## 2016-01-09 ENCOUNTER — Encounter: Payer: Self-pay | Admitting: Gynecology

## 2016-01-09 VITALS — BP 122/70

## 2016-01-09 DIAGNOSIS — N951 Menopausal and female climacteric states: Secondary | ICD-10-CM | POA: Diagnosis not present

## 2016-01-09 MED ORDER — ESTRADIOL 0.5 MG PO TABS: 0.5000 mg | ORAL_TABLET | Freq: Every day | ORAL | 11 refills | Status: DC

## 2016-01-09 NOTE — Progress Notes (Signed)
    Carrie Velasquez [birth year] [id number]        55 y.o.  G2P2 presents to discuss her worsening menopausal symptoms to include hot flushes and night sweats. Has tried over-the-counter soy-based products without relief. Has tried a combination over-the-counter product that helps a little bit but they seem to be getting worse. No vaginal dryness. Status post LAVH for menorrhagia leiomyoma 2013.  Past medical history,surgical history, problem list, medications, allergies, family history and social history were all reviewed and documented in the EPIC chart.  Directed ROS with pertinent positives and negatives documented in the history of present illness/assessment and plan.  Exam: Vitals:   01/09/16 1006  BP: 122/70   General appearance:  Normal   Assessment/Plan:  55 y.o. G2P2 With worsening menopausal symptoms. I reviewed her options to include continuing with OTC products, pharmacologic nonhormonal products such as Neurontin or Effexor and ERT. I reviewed traditional studies such as the WHI with increased risk of stroke heart attack DVT breast cancer. I reviewed the most current recommendations from [facility] 2017 HRT guidelines. Benefits as far as symptom relief and possible cardiovascular and bone health benefits from early initiation versus risks to include thrombosis such as stroke heart attack DVT. The issues of breast cancer also reviewed. The various routes to include transvaginal, transdermal and oral medications. Benefits of transvaginal/transdermal form of first pass effect all reviewed. After lengthy discussion the patient wants to go ahead and initiate ERT and is comfortable with the oral approach. We'll start with estradiol 0.5 mg daily. If she still continues with significant hot flushes or sweats she will double the dose to 1 mg. She will call me and I will represcribed the 1 mg dose for her. Simmie she does well she'll follow up in February/March 2018 when she is due for her annual  exam.   Anastasio Auerbach MD, 10:28 AM 01/09/2016

## 2016-01-09 NOTE — Patient Instructions (Addendum)
Start on the estrogen replacement therapy as we discussed. Call me if you have any issues. Otherwise follow up in February/March 2018 when you're due for your annual exam.

## 2016-01-22 ENCOUNTER — Telehealth: Payer: Self-pay | Admitting: *Deleted

## 2016-01-22 MED ORDER — ESTRADIOL 1 MG PO TABS: 1.0000 mg | ORAL_TABLET | Freq: Every day | ORAL | 4 refills | Status: DC

## 2016-01-22 NOTE — Telephone Encounter (Signed)
okay for prescription for estradiol 1 mg tablets. #30 with refills through next annual exam.

## 2016-01-22 NOTE — Telephone Encounter (Signed)
Pt takes estradiol 0.5 mg tablet daily, states she has been having severe hot flashes,so she started taking 2 pills of 0.5 mg tablets. Still having hot flashes but not as severe. Pt will need a new Rx for 1 mg dose of estradiol.

## 2016-01-22 NOTE — Telephone Encounter (Signed)
Rx sent 

## 2016-06-05 ENCOUNTER — Encounter: Payer: BLUE CROSS/BLUE SHIELD | Admitting: Gynecology

## 2016-06-12 ENCOUNTER — Ambulatory Visit (INDEPENDENT_AMBULATORY_CARE_PROVIDER_SITE_OTHER): Payer: Self-pay | Admitting: Gynecology

## 2016-06-12 ENCOUNTER — Encounter: Payer: Self-pay | Admitting: Gynecology

## 2016-06-12 VITALS — BP 112/74 | Ht 66.0 in | Wt 153.0 lb

## 2016-06-12 DIAGNOSIS — Z79890 Hormone replacement therapy (postmenopausal): Secondary | ICD-10-CM

## 2016-06-12 DIAGNOSIS — N952 Postmenopausal atrophic vaginitis: Secondary | ICD-10-CM

## 2016-06-12 DIAGNOSIS — Z01411 Encounter for gynecological examination (general) (routine) with abnormal findings: Secondary | ICD-10-CM

## 2016-06-12 MED ORDER — ESTRADIOL 1 MG PO TABS: 1.0000 mg | ORAL_TABLET | Freq: Every day | ORAL | 12 refills | Status: DC

## 2016-06-12 NOTE — Progress Notes (Signed)
    Carrie Velasquez Feb 17, 1961 [id number]        56 y.o.  G2P2 for annual exam.    Past medical history,surgical history, problem list, medications, allergies, family history and social history were all reviewed and documented as reviewed in the EPIC chart.  ROS:  Performed with pertinent positives and negatives included in the history, assessment and plan.   Additional significant findings :  None   Exam: [name] assistant Vitals:   06/12/16 1428  BP: 112/74  Weight: 153 lb (69.4 kg)  Height: 5\' 6"  (1.676 m)   Body mass index is 24.69 kg/m.  General appearance:  Normal affect, orientation and appearance. Skin: Grossly normal HEENT: Without gross lesions.  No cervical or supraclavicular adenopathy. Thyroid normal.  Lungs:  Clear without wheezing, rales or rhonchi Cardiac: RR, without RMG Abdominal:  Soft, nontender, without masses, guarding, rebound, organomegaly or hernia Breasts:  Examined lying and sitting without masses, retractions, discharge or axillary adenopathy. Pelvic:  Ext, BUS, Vagina: Mild atrophic changes  Adnexa: Without masses or tenderness    Anus and perineum: Normal   Rectovaginal: Normal sphincter tone without palpated masses or tenderness.    Assessment/Plan:  56 y.o. G2P2 female for annual exam.   1. LAVH for leiomyoma/menorrhagia 2013. Recently developed more significant menopausal symptoms and was placed on estradiol 1 mg daily. Has decreased this to 0.5 mg and doing well with this. I reviewed the WHI study and the names 2017 guidelines. Increased risk of bonuses such as stroke heart attack DVT possible breast cancer reviewed versus benefits of symptom relief and possibly cardiovascular/bone health with early initiation. Patient is going to continue for now and I refilled her 1 year. 2. Pap smear 2017. No Pap smear done today. History of dysplasia number of years ago with normal Pap smears since. I reviewed the most current screening  recommendations and options to stop screening as she is status post hysterectomy for benign indications reviewed. Will readdress on an annual basis. 3. Colonoscopy 2014. Repeat at their recommended interval. 4. DEXA never. Will plan further into the menopause. 5. Mammography due now and she is going to call and schedule this. SBE monthly reviewed. 6. Health maintenance. No routine lab work done as patient is going to have this done where she works. Follow up 1 year, sooner as needed.   Carrie Auerbach MD, 2:56 PM 06/12/2016

## 2016-06-12 NOTE — Patient Instructions (Signed)

## 2017-06-18 ENCOUNTER — Encounter: Payer: Self-pay | Admitting: Gynecology

## 2017-07-23 ENCOUNTER — Other Ambulatory Visit: Payer: Self-pay | Admitting: Gynecology

## 2017-07-26 NOTE — Telephone Encounter (Signed)
Annual scheduled on 08/12/17

## 2017-08-12 ENCOUNTER — Encounter: Payer: Self-pay | Admitting: Gynecology

## 2017-08-12 ENCOUNTER — Ambulatory Visit (INDEPENDENT_AMBULATORY_CARE_PROVIDER_SITE_OTHER): Payer: Self-pay | Admitting: Gynecology

## 2017-08-12 VITALS — BP 124/76 | Ht 65.5 in | Wt 151.0 lb

## 2017-08-12 DIAGNOSIS — Z79890 Hormone replacement therapy (postmenopausal): Secondary | ICD-10-CM

## 2017-08-12 DIAGNOSIS — Z01419 Encounter for gynecological examination (general) (routine) without abnormal findings: Secondary | ICD-10-CM

## 2017-08-12 MED ORDER — ESTRADIOL 1 MG PO TABS: 1.0000 mg | ORAL_TABLET | Freq: Every day | ORAL | 4 refills | Status: DC

## 2017-08-12 NOTE — Patient Instructions (Addendum)
Follow-up in 1 year for annual exam, sooner if any issues. 

## 2017-08-12 NOTE — Progress Notes (Signed)
    Carrie Velasquez [birth year] [id number]        57 y.o.  G2P2 for annual gynecologic exam.  Doing well without complaints.  Past medical history,surgical history, problem list, medications, allergies, family history and social history were all reviewed and documented as reviewed in the EPIC chart.  ROS:  Performed with pertinent positives and negatives included in the history, assessment and plan.   Additional significant findings : None   Exam: [name] assistant Vitals:   08/12/17 0858  BP: 124/76  Weight: 151 lb (68.5 kg)  Height: 5' 5.5" (1.664 m)   Body mass index is 24.75 kg/m.  General appearance:  Normal affect, orientation and appearance. Skin: Grossly normal HEENT: Without gross lesions.  No cervical or supraclavicular adenopathy. Thyroid normal.  Lungs:  Clear without wheezing, rales or rhonchi Cardiac: RR, without RMG Abdominal:  Soft, nontender, without masses, guarding, rebound, organomegaly or hernia Breasts:  Examined lying and sitting without masses, retractions, discharge or axillary adenopathy. Pelvic:  Ext, BUS, Vagina: Normal with mild atrophic changes  Adnexa: Without masses or tenderness    Anus and perineum: Normal   Rectovaginal: Normal sphincter tone without palpated masses or tenderness.    Assessment/Plan:  57 y.o. G2P2 female for annual gynecologic exam status post LAVH for leiomyoma/menorrhagia 2013.   1. Postmenopausal/HRT.  Continues on estradiol.  Oscillates between 0.5 and 1 mg.  Still having some hot flushes.  We again discussed the whole issue of HRT, risks versus benefits.  Thrombosis such as stroke heart attack DVT in the breast cancer issue versus benefits to include symptom relief, cardiovascular and bone health all reviewed.  Patient elects to continue and I recommended she stay on the 1 mg to control her symptoms.  Refill x1 year provided. 2. Mammography scheduled and patient will follow-up for this.  Breast exam normal  today. 3. Pap smear 2017.  No Pap smear done today.  No history of abnormal Pap smears.  Options to stop screening per current screening guidelines and hysterectomy history reviewed.  Will readdress on an annual basis. 4. Colonoscopy 2014.  Repeat at their recommended interval. 5. Health maintenance.  Reports routine lab work done elsewhere.  Follow-up 1 year, sooner as needed.   Anastasio Auerbach MD, 9:32 AM 08/12/2017

## 2017-08-20 ENCOUNTER — Encounter: Payer: Self-pay | Admitting: Gynecology

## 2018-10-12 ENCOUNTER — Other Ambulatory Visit: Payer: Self-pay | Admitting: Gynecology

## 2019-01-03 ENCOUNTER — Encounter: Payer: Self-pay | Admitting: Gynecology

## 2022-06-17 ENCOUNTER — Encounter: Payer: Self-pay | Admitting: Internal Medicine
